# Patient Record
Sex: Female | Born: 1990 | Race: Black or African American | Hispanic: No | Marital: Single | Smoking: Former smoker
Health system: Southern US, Community
[De-identification: ages and names within clinical notes are randomized; demographics above are authoritative.]

## PROBLEM LIST (undated history)

## (undated) ENCOUNTER — Inpatient Hospital Stay (HOSPITAL_COMMUNITY): Payer: Commercial Managed Care - HMO

## (undated) DIAGNOSIS — F419 Anxiety disorder, unspecified: Secondary | ICD-10-CM

## (undated) DIAGNOSIS — E785 Hyperlipidemia, unspecified: Secondary | ICD-10-CM

## (undated) DIAGNOSIS — K219 Gastro-esophageal reflux disease without esophagitis: Secondary | ICD-10-CM

## (undated) DIAGNOSIS — J45909 Unspecified asthma, uncomplicated: Secondary | ICD-10-CM

## (undated) DIAGNOSIS — I10 Essential (primary) hypertension: Secondary | ICD-10-CM

## (undated) DIAGNOSIS — E669 Obesity, unspecified: Secondary | ICD-10-CM

## (undated) DIAGNOSIS — O24419 Gestational diabetes mellitus in pregnancy, unspecified control: Secondary | ICD-10-CM

## (undated) HISTORY — DX: Hyperlipidemia, unspecified: E78.5

## (undated) HISTORY — DX: Gastro-esophageal reflux disease without esophagitis: K21.9

## (undated) HISTORY — PX: NO PAST SURGERIES: SHX2092

---

## 2012-03-31 ENCOUNTER — Emergency Department (HOSPITAL_COMMUNITY)
Admission: EM | Admit: 2012-03-31 | Discharge: 2012-03-31 | Disposition: A | Discharge status: Home / Self Care | Payer: Self-pay | Attending: Emergency Medicine | Admitting: Emergency Medicine

## 2012-03-31 ENCOUNTER — Encounter (HOSPITAL_COMMUNITY): Payer: Self-pay | Admitting: *Deleted

## 2012-03-31 ENCOUNTER — Emergency Department (HOSPITAL_COMMUNITY): Payer: Self-pay

## 2012-03-31 DIAGNOSIS — F17200 Nicotine dependence, unspecified, uncomplicated: Secondary | ICD-10-CM | POA: Insufficient documentation

## 2012-03-31 DIAGNOSIS — R079 Chest pain, unspecified: Secondary | ICD-10-CM | POA: Insufficient documentation

## 2012-03-31 DIAGNOSIS — Z79899 Other long term (current) drug therapy: Secondary | ICD-10-CM | POA: Insufficient documentation

## 2012-03-31 DIAGNOSIS — I10 Essential (primary) hypertension: Secondary | ICD-10-CM | POA: Insufficient documentation

## 2012-03-31 DIAGNOSIS — K219 Gastro-esophageal reflux disease without esophagitis: Secondary | ICD-10-CM | POA: Insufficient documentation

## 2012-03-31 HISTORY — DX: Essential (primary) hypertension: I10

## 2012-03-31 LAB — CBC
HCT: 34.5 % — ABNORMAL LOW (ref 36.0–46.0)
Hemoglobin: 11.2 g/dL — ABNORMAL LOW (ref 12.0–15.0)
MCHC: 32.5 g/dL (ref 30.0–36.0)

## 2012-03-31 LAB — POCT I-STAT TROPONIN I

## 2012-03-31 LAB — BASIC METABOLIC PANEL
CO2: 25 mEq/L (ref 19–32)
Calcium: 9.5 mg/dL (ref 8.4–10.5)
Glucose, Bld: 107 mg/dL — ABNORMAL HIGH (ref 70–99)
Potassium: 4.1 mEq/L (ref 3.5–5.1)
Sodium: 139 mEq/L (ref 135–145)

## 2012-03-31 MED ORDER — OMEPRAZOLE 20 MG PO CPDR: 20.0000 mg | DELAYED_RELEASE_CAPSULE | Freq: Every day | ORAL | Status: DC

## 2012-03-31 MED ORDER — GI COCKTAIL ~~LOC~~
30.0000 mL | Freq: Once | ORAL | Status: AC
  Administered 2012-03-31: 30 mL via ORAL
  Filled 2012-03-31: qty 30

## 2012-03-31 NOTE — ED Notes (Signed)
Pt c/o chest pressure for about a week, intermittent, center chest, non radiating, pressure increases when pt lays down, mild shortness of breath associated with the pressure. Pt also reports wheezing when she breaths. Respirations are equal and unlabored, skin warm and dry, A&Ox4.

## 2012-03-31 NOTE — ED Provider Notes (Signed)
History     CSN: [id number]  Arrival date & time 03/31/12  0344   First MD Initiated Contact with Patient 03/31/12 0444      Chief Complaint  Patient presents with  . Chest Pain    (Consider location/radiation/quality/duration/timing/severity/associated sxs/prior treatment) The history is provided by the patient.   patient reports a long-standing history of gastroesophageal reflux disease.  This evening she developed worsening discomfort in her chest is worse when she lies flat.  Her symptoms are worsened by food such as fast food and spicy food.  She has no exertional shortness of breath.  No shortness of breath at rest.  No history of DVT or pulmonary embolism.  The radiation of the chest discomfort.  No cough or congestion.  No fevers or chills.  No recent sick contacts.  She denies back pain or flank pain.  No abdominal pain.  No nausea vomiting or diarrhea.  When the discomfort comes on she does feel slightly short of breath.  At this time she is without any shortness of breath.  She reports difficulty sleeping because discomfort and her concern regarding her symptoms .  No family history of early cardiac disease.  Her symptoms are mild in severity.    Past Medical History  Diagnosis Date  . Hypertension     History reviewed. No pertinent past surgical history.  History reviewed. No pertinent family history.  History  Substance Use Topics  . Smoking status: Current Some Day Smoker  . Smokeless tobacco: Never Used  . Alcohol Use: Yes     Comment: occassionally    OB History    Grav Para Term Preterm Abortions TAB SAB Ect Mult Living                  Review of Systems  All other systems reviewed and are negative.    Allergies  Review of patient's allergies indicates no known allergies.  Home Medications   Current Outpatient Rx  Name  Route  Sig  Dispense  Refill  . LISINOPRIL-HYDROCHLOROTHIAZIDE 20-25 MG PO TABS   Oral   Take 1 tablet by mouth daily.          Marland Kitchen OMEPRAZOLE 20 MG PO CPDR   Oral   Take 1 capsule (20 mg total) by mouth daily.   30 capsule   0     BP 128/78  Pulse 81  SpO2 100%  LMP 03/14/2012  Physical Exam  Nursing note and vitals reviewed. Constitutional: She is oriented to person, place, and time. She appears well-developed and well-nourished. No distress.       Morbidly obese  HENT:  Head: Normocephalic and atraumatic.  Eyes: EOM are normal.  Neck: Normal range of motion.  Cardiovascular: Normal rate, regular rhythm and normal heart sounds.   Pulmonary/Chest: Effort normal and breath sounds normal.  Abdominal: Soft. She exhibits no distension. There is no tenderness.  Musculoskeletal: Normal range of motion. She exhibits no edema and no tenderness.  Neurological: She is alert and oriented to person, place, and time.  Skin: Skin is warm and dry.  Psychiatric: She has a normal mood and affect. Judgment normal.    ED Course  Procedures (including critical care time)  Labs Reviewed  CBC - Abnormal; Notable for the following:    Hemoglobin 11.2 (*)     HCT 34.5 (*)     Platelets 432 (*)     All other components within normal limits  BASIC METABOLIC PANEL - Abnormal;  Notable for the following:    Glucose, Bld 107 (*)     All other components within normal limits  PRO B NATRIURETIC PEPTIDE  POCT I-STAT TROPONIN I   Dg Chest 2 View  03/31/2012  *RADIOLOGY REPORT*  Clinical Data: Chest pain and pressure.  CHEST - 2 VIEW  Comparison: None.  Findings: The heart size and pulmonary vascularity are normal. The lungs appear clear and expanded without focal air space disease or consolidation. No blunting of the costophrenic angles.  No pneumothorax.  Mediastinal contours appear intact.  IMPRESSION: No evidence of active pulmonary disease.   Original Report Authenticated By: Burman Nieves, M.D.    I personally reviewed the imaging tests through PACS system I reviewed available ER/hospitalization records through the  EMR   Date: 03/31/2012  Rate: 84  Rhythm: normal sinus rhythm  QRS Axis: normal  Intervals: normal  ST/T Wave abnormalities: normal  Conduction Disutrbances: none  Narrative Interpretation:   Old EKG Reviewed: no prior ecg     1. Chest pain   2. GERD (gastroesophageal reflux disease)       MDM  5:55 AM Patient feels much better after GI cocktail.  I suspect this is GERD.  We had a prolonged discussion about weight loss and importance of diet, exercise.  Doubt pulmonary embolism.  Patient is PERC negative.  Discharge home in good condition        Lyanne Co, MD 03/31/12 [phone]

## 2012-03-31 NOTE — ED Notes (Signed)
Patient transported to X-ray 

## 2012-08-07 ENCOUNTER — Encounter (HOSPITAL_COMMUNITY): Payer: Self-pay | Admitting: *Deleted

## 2012-08-07 ENCOUNTER — Emergency Department (HOSPITAL_COMMUNITY)
Admission: EM | Admit: 2012-08-07 | Discharge: 2012-08-07 | Disposition: A | Discharge status: Home / Self Care | Payer: Self-pay | Attending: Emergency Medicine | Admitting: Emergency Medicine

## 2012-08-07 DIAGNOSIS — K089 Disorder of teeth and supporting structures, unspecified: Secondary | ICD-10-CM | POA: Insufficient documentation

## 2012-08-07 DIAGNOSIS — I10 Essential (primary) hypertension: Secondary | ICD-10-CM | POA: Insufficient documentation

## 2012-08-07 DIAGNOSIS — Z79899 Other long term (current) drug therapy: Secondary | ICD-10-CM | POA: Insufficient documentation

## 2012-08-07 DIAGNOSIS — F17200 Nicotine dependence, unspecified, uncomplicated: Secondary | ICD-10-CM | POA: Insufficient documentation

## 2012-08-07 DIAGNOSIS — K0889 Other specified disorders of teeth and supporting structures: Secondary | ICD-10-CM

## 2012-08-07 MED ORDER — IBUPROFEN 600 MG PO TABS: 600.0000 mg | ORAL_TABLET | Freq: Four times a day (QID) | ORAL | Status: DC | PRN

## 2012-08-07 MED ORDER — HYDROCODONE-ACETAMINOPHEN 5-325 MG PO TABS: 1.0000 | ORAL_TABLET | ORAL | Status: DC | PRN

## 2012-08-07 MED ORDER — PENICILLIN V POTASSIUM 500 MG PO TABS: 500.0000 mg | ORAL_TABLET | Freq: Three times a day (TID) | ORAL | Status: DC

## 2012-08-07 MED ORDER — PENICILLIN V POTASSIUM 250 MG PO TABS
500.0000 mg | ORAL_TABLET | Freq: Once | ORAL | Status: AC
  Administered 2012-08-07: 500 mg via ORAL
  Filled 2012-08-07: qty 2

## 2012-08-07 NOTE — ED Provider Notes (Signed)
History     CSN: [id number]  Arrival date & time 08/07/12  Tammie Thomas   First MD Initiated Contact with Patient 08/07/12 0710      Chief Complaint  Patient presents with  . Dental Pain    (Consider location/radiation/quality/duration/timing/severity/associated sxs/prior treatment) HPI  22 year old female presents complaining of dental pain. Patient reports for the past week she has gradual onset of pain to the right lower molar. Describe pain as achy sensation, 7/10, radiates to her right ear, worsening with anything cold. No fever, chills, hearing changes, sore throat, runny nose, sneezing, neck pain, or rash. Denies any recent trauma. Patient took ibuprofen this morning with minimal relief. Patient is an occasional smoker. No drugs allergies.  Past Medical History  Diagnosis Date  . Hypertension     History reviewed. No pertinent past surgical history.  No family history on file.  History  Substance Use Topics  . Smoking status: Current Some Day Smoker  . Smokeless tobacco: Never Used  . Alcohol Use: Yes     Comment: occassionally    OB History   Grav Para Term Preterm Abortions TAB SAB Ect Mult Living                  Review of Systems  Constitutional: Negative for fever.  HENT: Positive for dental problem.   Skin: Negative for rash.  Neurological: Negative for headaches.    Allergies  Review of patient's allergies indicates no known allergies.  Home Medications   Current Outpatient Rx  Name  Route  Sig  Dispense  Refill  . ibuprofen (ADVIL,MOTRIN) 200 MG tablet   Oral   Take 400 mg by mouth every 6 (six) hours as needed for pain.         Marland Kitchen lisinopril-hydrochlorothiazide (PRINZIDE,ZESTORETIC) 20-25 MG per tablet   Oral   Take 1 tablet by mouth daily.         Marland Kitchen omeprazole (PRILOSEC) 20 MG capsule   Oral   Take 1 capsule (20 mg total) by mouth daily.   30 capsule   0     BP 142/81  Pulse 80  Temp(Src) 98.2 F (36.8 C) (Oral)  Resp 18  SpO2  95%  LMP 07/11/2012  Physical Exam  Nursing note and vitals reviewed. Constitutional: She appears well-developed and well-nourished. No distress.  HENT:  Head: Atraumatic.  Mouth: Normal dentition. Tenderness noted to right lower molar without obvious abscess however this is small old dental avulsion to the edge of the molar tooth. No obvious abscess noted. Surrounding gingiva tender to the touch.  No deep tissue infection noted, no trismus  Ear: Normal ear canals, normal TMs bilaterally  Eyes: Conjunctivae are normal.  Neck: Neck supple.  Lymphadenopathy:    She has no cervical adenopathy.  Skin: No rash noted.    ED Course  Procedures (including critical care time)  7:20 AM Patient presents with dental pain. No obvious dental abscess amenable for drainage. No evidence of deep tissue infection. Patient is instructed to take antibiotic, and pain medication. Follow up with dentist in 2 days.  Labs Reviewed - No data to display No results found.   1. Pain, dental       MDM  BP 142/81  Pulse 80  Temp(Src) 98.2 F (36.8 C) (Oral)  Resp 18  SpO2 95%  LMP 07/11/2012  I have reviewed nursing notes and vital signs.  I reviewed available ER/hospitalization records thought the EMR  Fayrene Helper, PA-C 08/07/12 [phone]

## 2012-08-07 NOTE — ED Notes (Signed)
C/o R lower toothache, radiating to upper jaw and R ear. Onset 1 week ago, (denies: nv, drainage or fever), took ibuprofen 400mg  around 0300.

## 2012-08-13 NOTE — ED Provider Notes (Signed)
Medical screening examination/treatment/procedure(s) were performed by non-physician practitioner and as supervising physician I was immediately available for consultation/collaboration.  Tunisha Ruland M Clifford Benninger, MD 08/13/12 0011 

## 2012-08-21 ENCOUNTER — Encounter (HOSPITAL_COMMUNITY): Payer: Self-pay | Admitting: Emergency Medicine

## 2012-08-21 DIAGNOSIS — R0789 Other chest pain: Secondary | ICD-10-CM | POA: Insufficient documentation

## 2012-08-21 DIAGNOSIS — R42 Dizziness and giddiness: Secondary | ICD-10-CM | POA: Insufficient documentation

## 2012-08-21 DIAGNOSIS — J209 Acute bronchitis, unspecified: Secondary | ICD-10-CM | POA: Insufficient documentation

## 2012-08-21 DIAGNOSIS — F17200 Nicotine dependence, unspecified, uncomplicated: Secondary | ICD-10-CM | POA: Insufficient documentation

## 2012-08-21 DIAGNOSIS — R059 Cough, unspecified: Secondary | ICD-10-CM | POA: Insufficient documentation

## 2012-08-21 DIAGNOSIS — E669 Obesity, unspecified: Secondary | ICD-10-CM | POA: Insufficient documentation

## 2012-08-21 DIAGNOSIS — Z8249 Family history of ischemic heart disease and other diseases of the circulatory system: Secondary | ICD-10-CM | POA: Insufficient documentation

## 2012-08-21 DIAGNOSIS — F411 Generalized anxiety disorder: Secondary | ICD-10-CM | POA: Insufficient documentation

## 2012-08-21 DIAGNOSIS — R05 Cough: Secondary | ICD-10-CM | POA: Insufficient documentation

## 2012-08-21 DIAGNOSIS — Z9119 Patient's noncompliance with other medical treatment and regimen: Secondary | ICD-10-CM | POA: Insufficient documentation

## 2012-08-21 DIAGNOSIS — I10 Essential (primary) hypertension: Secondary | ICD-10-CM | POA: Insufficient documentation

## 2012-08-21 DIAGNOSIS — Z79899 Other long term (current) drug therapy: Secondary | ICD-10-CM | POA: Insufficient documentation

## 2012-08-21 DIAGNOSIS — R0602 Shortness of breath: Secondary | ICD-10-CM | POA: Insufficient documentation

## 2012-08-21 DIAGNOSIS — Z91199 Patient's noncompliance with other medical treatment and regimen due to unspecified reason: Secondary | ICD-10-CM | POA: Insufficient documentation

## 2012-08-21 DIAGNOSIS — R7309 Other abnormal glucose: Secondary | ICD-10-CM | POA: Insufficient documentation

## 2012-08-21 MED ORDER — ASPIRIN 81 MG PO CHEW
324.0000 mg | CHEWABLE_TABLET | Freq: Once | ORAL | Status: AC
  Administered 2012-08-21: 324 mg via ORAL
  Filled 2012-08-21: qty 4

## 2012-08-21 NOTE — ED Notes (Signed)
PT. REPORTS INTERMITTENT MID CHEST PAIN WITH SOB , NAUSEA AND PRODUCTIVE COUGH /DIZZINESS. NO MED TAKEN  PTA.

## 2012-08-22 ENCOUNTER — Emergency Department (HOSPITAL_COMMUNITY): Admit: 2012-08-22 | Discharge: 2012-08-22 | Disposition: A | Discharge status: Home / Self Care | Payer: Self-pay

## 2012-08-22 ENCOUNTER — Emergency Department (HOSPITAL_COMMUNITY)
Admission: EM | Admit: 2012-08-22 | Discharge: 2012-08-22 | Disposition: A | Discharge status: Home / Self Care | Payer: Self-pay | Attending: Emergency Medicine | Admitting: Emergency Medicine

## 2012-08-22 DIAGNOSIS — R0789 Other chest pain: Secondary | ICD-10-CM

## 2012-08-22 HISTORY — DX: Anxiety disorder, unspecified: F41.9

## 2012-08-22 HISTORY — DX: Obesity, unspecified: E66.9

## 2012-08-22 LAB — CBC WITH DIFFERENTIAL/PLATELET
HCT: 32.7 % — ABNORMAL LOW (ref 36.0–46.0)
Hemoglobin: 11.1 g/dL — ABNORMAL LOW (ref 12.0–15.0)
Lymphocytes Relative: 52 % — ABNORMAL HIGH (ref 12–46)
Lymphs Abs: 4.1 10*3/uL — ABNORMAL HIGH (ref 0.7–4.0)
Monocytes Absolute: 0.3 10*3/uL (ref 0.1–1.0)
Monocytes Relative: 4 % (ref 3–12)
Neutro Abs: 3.2 10*3/uL (ref 1.7–7.7)
RBC: 4.11 MIL/uL (ref 3.87–5.11)
WBC: 7.9 10*3/uL (ref 4.0–10.5)

## 2012-08-22 LAB — POCT I-STAT TROPONIN I: Troponin i, poc: 0 ng/mL (ref 0.00–0.08)

## 2012-08-22 LAB — COMPREHENSIVE METABOLIC PANEL
BUN: 12 mg/dL (ref 6–23)
CO2: 27 mEq/L (ref 19–32)
Chloride: 101 mEq/L (ref 96–112)
Creatinine, Ser: 0.86 mg/dL (ref 0.50–1.10)
GFR calc non Af Amer: >90 mL/min (ref >90)
Total Bilirubin: 0.1 mg/dL — ABNORMAL LOW (ref 0.3–1.2)

## 2012-08-22 MED ORDER — ALBUTEROL SULFATE HFA 108 (90 BASE) MCG/ACT IN AERS
2.0000 | INHALATION_SPRAY | RESPIRATORY_TRACT | Status: DC | PRN
  Administered 2012-08-22: 2 via RESPIRATORY_TRACT
  Filled 2012-08-22 (×2): qty 6.7

## 2012-08-22 MED ORDER — AEROCHAMBER PLUS W/MASK MISC
1.0000 | Freq: Once | Status: AC
  Administered 2012-08-22: 1
  Filled 2012-08-22 (×2): qty 1

## 2012-08-22 NOTE — ED Notes (Signed)
Pt. Reports intermittent central chest pain/pressure, radiating to bilateral arms with dizziness, nausea, SOB. States pain is worse at night. States "I have anxiety so I just need to make sure it isn't serious because I make the pain worse when I get anxious about it". Pt. Resting comfortably in bed. Denies pain at this time.

## 2012-08-22 NOTE — ED Notes (Signed)
Pt. Alert and oriented x4. No respiratory distress noted.  

## 2012-08-22 NOTE — ED Provider Notes (Signed)
History     CSN: [id number]  Arrival date & time 08/21/12  2318   First MD Initiated Contact with Patient 08/22/12 0057      Chief Complaint  Patient presents with  . Chest Pain    (Consider location/radiation/quality/duration/timing/severity/associated sxs/prior treatment) HPI Place of chest pain onset 3 days ago intermittent with cough and shortness of breath and lightheadedness associated. Pain is improved with walking worse with coughing. No other complaint she admits to noncompliance with her blood pressure medication for several days as she has run out. She was diagnosed with hypertension 2 years ago after her father died and she was under a lot of stress. Chest pain is anterior nonradiating. None at present. Past Medical History  Diagnosis Date  . Hypertension   . Obesity   . Anxiety    cardiac risk factors hypertension, family history father had MI age 19  History reviewed. No pertinent past surgical history.  No family history on file.  History  Substance Use Topics  . Smoking status: Current Some Day Smoker  . Smokeless tobacco: Never Used  . Alcohol Use: Yes     Comment: occassionally   Former smoker no alcohol no drugs OB History   Grav Para Term Preterm Abortions TAB SAB Ect Mult Living                  Review of Systems  Respiratory: Positive for cough and shortness of breath.   Cardiovascular: Positive for chest pain.  Neurological: Positive for dizziness.  All other systems reviewed and are negative.    Allergies  Review of patient's allergies indicates no known allergies.  Home Medications   Current Outpatient Rx  Name  Route  Sig  Dispense  Refill  . acetaminophen (TYLENOL) 500 MG tablet   Oral   Take 1,000 mg by mouth every 6 (six) hours as needed for pain.         Marland Kitchen lisinopril-hydrochlorothiazide (PRINZIDE,ZESTORETIC) 20-25 MG per tablet   Oral   Take 1 tablet by mouth daily.           BP 126/77  Pulse 73  Temp(Src) 98.7 F  (37.1 C) (Oral)  Resp 17  SpO2 100%  LMP 08/12/2012  Physical Exam  Nursing note and vitals reviewed. Constitutional: She appears well-developed and well-nourished.  HENT:  Head: Normocephalic and atraumatic.  Eyes: Conjunctivae are normal. Pupils are equal, round, and reactive to light.  Neck: Neck supple. No tracheal deviation present. No thyromegaly present.  Cardiovascular: Normal rate and regular rhythm.   No murmur heard. Pulmonary/Chest: Effort normal and breath sounds normal.  Abdominal: Soft. Bowel sounds are normal. She exhibits no distension. There is no tenderness.  obese  Musculoskeletal: Normal range of motion. She exhibits no edema and no tenderness.  Neurological: She is alert. Coordination normal.  Skin: Skin is warm and dry. No rash noted.  Psychiatric: She has a normal mood and affect.    ED Course  Procedures (including critical care time)  Labs Reviewed  CBC WITH DIFFERENTIAL - Abnormal; Notable for the following:    Hemoglobin 11.1 (*)    HCT 32.7 (*)    Neutrophils Relative 40 (*)    Lymphocytes Relative 52 (*)    Lymphs Abs 4.1 (*)    All other components within normal limits  COMPREHENSIVE METABOLIC PANEL - Abnormal; Notable for the following:    Glucose, Bld 134 (*)    Albumin 3.3 (*)    Total Bilirubin 0.1 (*)  All other components within normal limits   Dg Chest 2 View  08/22/2012  *RADIOLOGY REPORT*  Clinical Data: Chest pain  CHEST - 2 VIEW  Comparison: 03/31/2012  Findings: Lungs clear.  Cardiomediastinal contours within normal range.  No pleural effusion or pneumothorax.  No acute osseous finding.  IMPRESSION: No radiographic evidence of acute cardiopulmonary process.   Original Report Authenticated By: Jearld Lesch, M.D.    Chest x-ray viewed by me  No diagnosis found.  Date: 08/22/2012  Rate: 80  Rhythm: normal sinus rhythm  QRS Axis: normal  Intervals: normal  ST/T Wave abnormalities: nonspecific T wave changes  Conduction  Disutrbances:none  Narrative Interpretation:   Old EKG Reviewed: No change from 03/31/2012 interpreted by me  Results for orders placed during the hospital encounter of 08/22/12  CBC WITH DIFFERENTIAL      Result Value Range   WBC 7.9  4.0 - 10.5 K/uL   RBC 4.11  3.87 - 5.11 MIL/uL   Hemoglobin 11.1 (*) 12.0 - 15.0 g/dL   HCT 11.9 (*) 14.7 - 82.9 %   MCV 79.6  78.0 - 100.0 fL   MCH 27.0  26.0 - 34.0 pg   MCHC 33.9  30.0 - 36.0 g/dL   RDW 56.2  13.0 - 86.5 %   Platelets 371  150 - 400 K/uL   Neutrophils Relative 40 (*) 43 - 77 %   Neutro Abs 3.2  1.7 - 7.7 K/uL   Lymphocytes Relative 52 (*) 12 - 46 %   Lymphs Abs 4.1 (*) 0.7 - 4.0 K/uL   Monocytes Relative 4  3 - 12 %   Monocytes Absolute 0.3  0.1 - 1.0 K/uL   Eosinophils Relative 4  0 - 5 %   Eosinophils Absolute 0.3  0.0 - 0.7 K/uL   Basophils Relative 1  0 - 1 %   Basophils Absolute 0.0  0.0 - 0.1 K/uL  COMPREHENSIVE METABOLIC PANEL      Result Value Range   Sodium 137  135 - 145 mEq/L   Potassium 3.7  3.5 - 5.1 mEq/L   Chloride 101  96 - 112 mEq/L   CO2 27  19 - 32 mEq/L   Glucose, Bld 134 (*) 70 - 99 mg/dL   BUN 12  6 - 23 mg/dL   Creatinine, Ser 7.84  0.50 - 1.10 mg/dL   Calcium 9.5  8.4 - 69.6 mg/dL   Total Protein 7.0  6.0 - 8.3 g/dL   Albumin 3.3 (*) 3.5 - 5.2 g/dL   AST 15  0 - 37 U/L   ALT 18  0 - 35 U/L   Alkaline Phosphatase 76  39 - 117 U/L   Total Bilirubin 0.1 (*) 0.3 - 1.2 mg/dL   GFR calc non Af Amer >90  >90 mL/min   GFR calc Af Amer >90  >90 mL/min   Dg Chest 2 View  08/22/2012  *RADIOLOGY REPORT*  Clinical Data: Chest pain  CHEST - 2 VIEW  Comparison: 03/31/2012  Findings: Lungs clear.  Cardiomediastinal contours within normal range.  No pleural effusion or pneumothorax.  No acute osseous finding.  IMPRESSION: No radiographic evidence of acute cardiopulmonary process.   Original Report Authenticated By: Jearld Lesch, M.D.      MDM     strongly doubt acute coronary syndrome in this young  menstruating female with highly atypical symptoms i.e. pain is improved with exertion. Doubt pulmonary embolism highly atypical symptoms symptoms are intermittent and  with cough. We will not restart her antihypertensive as she's had 3 normal blood pressure while here plan albuterol inhaler with spacer to go. Referral resource guide Diagnosis #1 atypical chest pain #2 bronchitis #hyperglycemia     Doug Sou, MD 08/22/12 0130

## 2012-10-24 ENCOUNTER — Encounter (HOSPITAL_COMMUNITY): Payer: Self-pay

## 2012-10-24 ENCOUNTER — Emergency Department (HOSPITAL_COMMUNITY)
Admission: EM | Admit: 2012-10-24 | Discharge: 2012-10-24 | Disposition: A | Discharge status: Home / Self Care | Payer: Self-pay | Attending: Emergency Medicine | Admitting: Emergency Medicine

## 2012-10-24 DIAGNOSIS — Z7982 Long term (current) use of aspirin: Secondary | ICD-10-CM | POA: Insufficient documentation

## 2012-10-24 DIAGNOSIS — F411 Generalized anxiety disorder: Secondary | ICD-10-CM | POA: Insufficient documentation

## 2012-10-24 DIAGNOSIS — E669 Obesity, unspecified: Secondary | ICD-10-CM | POA: Insufficient documentation

## 2012-10-24 DIAGNOSIS — Z3202 Encounter for pregnancy test, result negative: Secondary | ICD-10-CM | POA: Insufficient documentation

## 2012-10-24 DIAGNOSIS — R600 Localized edema: Secondary | ICD-10-CM

## 2012-10-24 DIAGNOSIS — F17200 Nicotine dependence, unspecified, uncomplicated: Secondary | ICD-10-CM | POA: Insufficient documentation

## 2012-10-24 DIAGNOSIS — R609 Edema, unspecified: Secondary | ICD-10-CM | POA: Insufficient documentation

## 2012-10-24 DIAGNOSIS — I10 Essential (primary) hypertension: Secondary | ICD-10-CM | POA: Insufficient documentation

## 2012-10-24 LAB — POCT I-STAT, CHEM 8
Chloride: 102 mEq/L (ref 96–112)
Creatinine, Ser: 0.8 mg/dL (ref 0.50–1.10)
Glucose, Bld: 97 mg/dL (ref 70–99)
HCT: 34 % — ABNORMAL LOW (ref 36.0–46.0)
Hemoglobin: 11.6 g/dL — ABNORMAL LOW (ref 12.0–15.0)
Potassium: 4.1 mEq/L (ref 3.5–5.1)
Sodium: 139 mEq/L (ref 135–145)

## 2012-10-24 LAB — URINALYSIS, ROUTINE W REFLEX MICROSCOPIC
Bilirubin Urine: NEGATIVE
Glucose, UA: NEGATIVE mg/dL
Hgb urine dipstick: NEGATIVE
Ketones, ur: NEGATIVE mg/dL
Leukocytes, UA: NEGATIVE
Nitrite: NEGATIVE
Protein, ur: NEGATIVE mg/dL
Specific Gravity, Urine: 1.015 (ref 1.005–1.030)
Urobilinogen, UA: 0.2 mg/dL (ref 0.0–1.0)
pH: 7.5 (ref 5.0–8.0)

## 2012-10-24 NOTE — ED Provider Notes (Signed)
History    CSN: [id number] Arrival date & time 10/24/12  0830  First MD Initiated Contact with Patient 10/24/12 [phone]     Chief Complaint  Patient presents with  . Leg Swelling   (Consider location/radiation/quality/duration/timing/severity/associated sxs/prior Treatment) HPI Patient presents to the emergency department with a complaint of bilateral lower leg swelling.  Patient, states, that this has been an ongoing problem, that she's noticed bruising in the back of her leg X2 days. Patient states, that she does not have any pain in her legs except when she stands for long periods of time.  Patient denies chest pain, shortness of breath, nausea, vomiting, diarrhea, abdominal pain, back pain, fever, cough, dizziness, weakness, numbness, syncope, rash or diarrhea.  Patient, states she's not had any redness or warmth to her legs.  Patient, states she, notices it's worse after standing at work for long periods of time.  Past Medical History  Diagnosis Date  . Hypertension   . Obesity   . Anxiety    History reviewed. No pertinent past surgical history. No family history on file. History  Substance Use Topics  . Smoking status: Current Some Day Smoker  . Smokeless tobacco: Never Used  . Alcohol Use: Yes     Comment: occassionally   OB History   Grav Para Term Preterm Abortions TAB SAB Ect Mult Living                 Review of Systems All other systems negative except as documented in the HPI. All pertinent positives and negatives as reviewed in the HPI. Allergies  Review of patient's allergies indicates no known allergies.  Home Medications   Current Outpatient Rx  Name  Route  Sig  Dispense  Refill  . aspirin 325 MG tablet   Oral   Take 650 mg by mouth daily.          BP 133/65  Pulse 60  Temp(Src) 98.7 F (37.1 C) (Oral)  Resp 19  Ht 5\' 9"  (1.753 m)  Wt 317 lb (143.79 kg)  BMI 46.79 kg/m2  SpO2 99%  LMP 10/12/2012 Physical Exam  Nursing note and vitals  reviewed. Constitutional: She is oriented to person, place, and time. She appears well-developed and well-nourished. No distress.  HENT:  Head: Normocephalic and atraumatic.  Mouth/Throat: Oropharynx is clear and moist.  Eyes: Pupils are equal, round, and reactive to light.  Neck: Normal range of motion. Neck supple.  Cardiovascular: Normal rate, regular rhythm and normal heart sounds.  Exam reveals no gallop and no friction rub.   No murmur heard. Pulmonary/Chest: Effort normal and breath sounds normal. No respiratory distress.  Abdominal: Soft. Bowel sounds are normal. She exhibits no distension.  Musculoskeletal:  Patient may have a small trace amount of edema in both lower legs.  No signs of redness or warmth to the lower extremities.  Patient has no calf pain  Neurological: She is alert and oriented to person, place, and time.  Skin: Skin is warm and dry.    ED Course  Procedures (including critical care time) Labs Reviewed  POCT I-STAT, CHEM 8 - Abnormal; Notable for the following:    Hemoglobin 11.6 (*)    HCT 34.0 (*)    All other components within normal limits  URINALYSIS, ROUTINE W REFLEX MICROSCOPIC  POCT PREGNANCY, URINE    Patient is advised to followup with her primary care Dr. I feel that the patient's trace edema is most likely related to her weight and  standing for long periods of time.  Patient does not have any signs of blood clot or congestive heart failure.  Patient's kidney function is also normal.  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

## 2012-10-24 NOTE — ED Notes (Signed)
Pt with bilateral lower extremity swelling for approx 1 month. Denies SOB or fever.

## 2012-10-25 NOTE — ED Provider Notes (Signed)
Medical screening examination/treatment/procedure(s) were performed by non-physician practitioner and as supervising physician I was immediately available for consultation/collaboration.   Skyeler Smola, MD 10/25/12 1635 

## 2012-12-19 ENCOUNTER — Encounter (HOSPITAL_COMMUNITY): Payer: Self-pay | Admitting: Physical Medicine and Rehabilitation

## 2012-12-19 ENCOUNTER — Emergency Department (HOSPITAL_COMMUNITY)
Admission: EM | Admit: 2012-12-19 | Discharge: 2012-12-19 | Disposition: A | Discharge status: Home / Self Care | Payer: Self-pay | Attending: Emergency Medicine | Admitting: Emergency Medicine

## 2012-12-19 DIAGNOSIS — F17200 Nicotine dependence, unspecified, uncomplicated: Secondary | ICD-10-CM | POA: Insufficient documentation

## 2012-12-19 DIAGNOSIS — E669 Obesity, unspecified: Secondary | ICD-10-CM | POA: Insufficient documentation

## 2012-12-19 DIAGNOSIS — I10 Essential (primary) hypertension: Secondary | ICD-10-CM | POA: Insufficient documentation

## 2012-12-19 DIAGNOSIS — M549 Dorsalgia, unspecified: Secondary | ICD-10-CM | POA: Insufficient documentation

## 2012-12-19 DIAGNOSIS — Z8659 Personal history of other mental and behavioral disorders: Secondary | ICD-10-CM | POA: Insufficient documentation

## 2012-12-19 DIAGNOSIS — Z7982 Long term (current) use of aspirin: Secondary | ICD-10-CM | POA: Insufficient documentation

## 2012-12-19 MED ORDER — IBUPROFEN 800 MG PO TABS: 800.0000 mg | ORAL_TABLET | Freq: Three times a day (TID) | ORAL | Status: DC

## 2012-12-19 MED ORDER — METHOCARBAMOL 500 MG PO TABS: 500.0000 mg | ORAL_TABLET | Freq: Two times a day (BID) | ORAL | Status: DC

## 2012-12-19 MED ORDER — KETOROLAC TROMETHAMINE 60 MG/2ML IM SOLN
60.0000 mg | Freq: Once | INTRAMUSCULAR | Status: AC
  Administered 2012-12-19: 60 mg via INTRAMUSCULAR
  Filled 2012-12-19: qty 2

## 2012-12-19 NOTE — ED Notes (Signed)
Pt presents to department for evaluation of back pain. Ongoing x1 week. Also states pain radiates to both arms and legs. 7/10 pain at the time, increases with movement. Denies recent injury. Pt is alert and oriented x4.

## 2012-12-19 NOTE — ED Provider Notes (Signed)
CSN: [id number]     Arrival date & time 12/19/12  1628 History   First MD Initiated Contact with Patient 12/19/12 1800     Chief Complaint  Patient presents with  . Back Pain   (Consider location/radiation/quality/duration/timing/severity/associated sxs/prior Treatment) HPI Comments: Patient presents emergency department with chief complaint of back pain. She states that she has had back pain times one week. She denies any known mechanism of injury. She states the pain is worsened with movement, twisting, and bending. She states the pain is 7/10, and endorses some radiation of her symptoms down the left arm. She is tried taking Tylenol with some relief. 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 She is on her feet walking a lot. She denies any fevers, IV drug use, or motor deficits. Denies any saddle anesthesia, or bowel or bladder incontinence. Denies any ataxia.  The history is provided by the patient. No language interpreter was used.    Past Medical History  Diagnosis Date  . Hypertension   . Obesity   . Anxiety    No past surgical history on file. No family history on file. History  Substance Use Topics  . Smoking status: Current Some Day Smoker  . Smokeless tobacco: Never Used  . Alcohol Use: Yes     Comment: occassionally   OB History   Grav Para Term Preterm Abortions TAB SAB Ect Mult Living                 Review of Systems  All other systems reviewed and are negative.    Allergies  Review of patient's allergies indicates no known allergies.  Home Medications   Current Outpatient Rx  Name  Route  Sig  Dispense  Refill  . aspirin 325 MG tablet   Oral   Take 650 mg by mouth daily.         Marland Kitchen aspirin-acetaminophen-caffeine (EXCEDRIN MIGRAINE) 250-250-65 MG per tablet   Oral   Take 1 tablet by mouth every 6 (six) hours as needed for pain.         Marland Kitchen ibuprofen (ADVIL,MOTRIN) 800 MG tablet   Oral   Take 1 tablet (800 mg total)  by mouth 3 (three) times daily.   21 tablet   0   . methocarbamol (ROBAXIN) 500 MG tablet   Oral   Take 1 tablet (500 mg total) by mouth 2 (two) times daily.   20 tablet   0    BP 138/84  Pulse 66  Temp(Src) 99 F (37.2 C) (Oral)  Resp 18  Wt 315 lb 4.8 oz (143.019 kg)  BMI 46.54 kg/m2  SpO2 100% Physical Exam  Nursing note and vitals reviewed. Constitutional: She is oriented to person, place, and time. She appears well-developed and well-nourished. No distress.  HENT:  Head: Normocephalic and atraumatic.  Eyes: Conjunctivae and EOM are normal. Right eye exhibits no discharge. Left eye exhibits no discharge. No scleral icterus.  Neck: Normal range of motion. Neck supple. No tracheal deviation present.  Cardiovascular: Normal rate, regular rhythm and normal heart sounds.  Exam reveals no gallop and no friction rub.   No murmur heard. Pulmonary/Chest: Effort normal and breath sounds normal. No respiratory distress. She has no wheezes.  Abdominal: Soft. She exhibits no distension. There is no tenderness.  Musculoskeletal: Normal range of motion.  Left-sided rhomboid muscles tender to palpation, no bony tenderness, step-offs, or gross abnormality or deformity of spine, patient is able to ambulate,  moves all extremities  Neurological: She is alert and oriented to person, place, and time.  Sensation and strength intact bilaterally  Skin: Skin is warm. She is not diaphoretic.  Psychiatric: She has a normal mood and affect. Her behavior is normal. Judgment and thought content normal.    ED Course  Procedures (including critical care time) Labs Review Labs Reviewed - No data to display Imaging Review No results found.  MDM   1. Back pain    Patient with back pain.  No neurological deficits and normal neuro exam.  Patient can walk but states is painful.  No loss of bowel or bladder control.  No concern for cauda equina.  No fever, night sweats, weight loss, h/o cancer, IVDU.   RICE protocol and pain medicine indicated and discussed with patient.      Roxy Horseman, PA-C 12/19/12 1816

## 2012-12-19 NOTE — ED Provider Notes (Signed)
Medical screening examination/treatment/procedure(s) were performed by non-physician practitioner and as supervising physician I was immediately available for consultation/collaboration.   Glynn Octave, MD 12/19/12 [phone]

## 2013-02-20 ENCOUNTER — Emergency Department (HOSPITAL_COMMUNITY)
Admission: EM | Admit: 2013-02-20 | Discharge: 2013-02-20 | Disposition: A | Discharge status: Home / Self Care | Payer: Self-pay | Attending: Emergency Medicine | Admitting: Emergency Medicine

## 2013-02-20 ENCOUNTER — Emergency Department (HOSPITAL_COMMUNITY): Payer: Self-pay

## 2013-02-20 ENCOUNTER — Encounter (HOSPITAL_COMMUNITY): Payer: Self-pay | Admitting: Emergency Medicine

## 2013-02-20 DIAGNOSIS — F411 Generalized anxiety disorder: Secondary | ICD-10-CM | POA: Insufficient documentation

## 2013-02-20 DIAGNOSIS — E669 Obesity, unspecified: Secondary | ICD-10-CM | POA: Insufficient documentation

## 2013-02-20 DIAGNOSIS — Z3202 Encounter for pregnancy test, result negative: Secondary | ICD-10-CM | POA: Insufficient documentation

## 2013-02-20 DIAGNOSIS — J45909 Unspecified asthma, uncomplicated: Secondary | ICD-10-CM | POA: Insufficient documentation

## 2013-02-20 DIAGNOSIS — M25519 Pain in unspecified shoulder: Secondary | ICD-10-CM | POA: Insufficient documentation

## 2013-02-20 DIAGNOSIS — Z79899 Other long term (current) drug therapy: Secondary | ICD-10-CM | POA: Insufficient documentation

## 2013-02-20 DIAGNOSIS — F17200 Nicotine dependence, unspecified, uncomplicated: Secondary | ICD-10-CM | POA: Insufficient documentation

## 2013-02-20 DIAGNOSIS — I10 Essential (primary) hypertension: Secondary | ICD-10-CM | POA: Insufficient documentation

## 2013-02-20 DIAGNOSIS — R000 Tachycardia, unspecified: Secondary | ICD-10-CM | POA: Insufficient documentation

## 2013-02-20 DIAGNOSIS — M791 Myalgia, unspecified site: Secondary | ICD-10-CM

## 2013-02-20 HISTORY — DX: Unspecified asthma, uncomplicated: J45.909

## 2013-02-20 LAB — COMPREHENSIVE METABOLIC PANEL
ALT: 19 U/L (ref 0–35)
AST: 16 U/L (ref 0–37)
Alkaline Phosphatase: 82 U/L (ref 39–117)
CO2: 25 mEq/L (ref 19–32)
Chloride: 100 mEq/L (ref 96–112)
GFR calc Af Amer: >90 mL/min (ref >90)
GFR calc non Af Amer: >90 mL/min (ref >90)
Glucose, Bld: 99 mg/dL (ref 70–99)
Sodium: 136 mEq/L (ref 135–145)
Total Bilirubin: 0.3 mg/dL (ref 0.3–1.2)

## 2013-02-20 LAB — URINALYSIS, ROUTINE W REFLEX MICROSCOPIC
Bilirubin Urine: NEGATIVE
Hgb urine dipstick: NEGATIVE
Ketones, ur: NEGATIVE mg/dL
Nitrite: NEGATIVE
Protein, ur: NEGATIVE mg/dL
Urobilinogen, UA: 0.2 mg/dL (ref 0.0–1.0)

## 2013-02-20 LAB — CBC
Hemoglobin: 12.3 g/dL (ref 12.0–15.0)
MCH: 27.8 pg (ref 26.0–34.0)
Platelets: 409 10*3/uL — ABNORMAL HIGH (ref 150–400)
RBC: 4.43 MIL/uL (ref 3.87–5.11)
WBC: 6.6 10*3/uL (ref 4.0–10.5)

## 2013-02-20 LAB — POCT I-STAT TROPONIN I: Troponin i, poc: 0 ng/mL (ref 0.00–0.08)

## 2013-02-20 MED ORDER — METHOCARBAMOL 500 MG PO TABS: 500.0000 mg | ORAL_TABLET | Freq: Three times a day (TID) | ORAL | Status: DC | PRN

## 2013-02-20 MED ORDER — NAPROXEN 500 MG PO TABS: 500.0000 mg | ORAL_TABLET | Freq: Two times a day (BID) | ORAL | Status: DC

## 2013-02-20 NOTE — ED Notes (Addendum)
Cp x 2 weeks  Some nausea  Cough pain rads to back and pt requesting urine preg test and she might have uti

## 2013-02-20 NOTE — ED Provider Notes (Addendum)
CSN: [id number]     Arrival date & time 02/20/13  1354 History   First MD Initiated Contact with Patient 02/20/13 1555     Chief Complaint  Patient presents with  . Chest Pain    HPI  Patient presents with shoulder pain she states initially for about one week's time. She states she was here about a week ago and had it looked at. In review of her chart this was August 31. In retrospect she states it was about a month ago.  Again her evaluation was a weeks ago.  She felt it might be related to some new exercises she was doing. She had a home DDD is doing some exercises. She is doing a modified portion which means she is doing her pushups from her knees rather than with full plank position.  She has a soreness in the left shoulder. Occasionally hurts to move. She is not having trouble sleeping. She feels like she is carrying stress in her shoulders and back.  No history of vascular abnormalities. No history of hemoglobinopathies. No prolonged immobilization cast splints fractures surgeries malignancies or other DVT or PE risks. She does smoke. She is not on hormones.  Past Medical History  Diagnosis Date  . Hypertension   . Obesity   . Anxiety   . Asthma    History reviewed. No pertinent past surgical history. History reviewed. No pertinent family history. History  Substance Use Topics  . Smoking status: Current Some Day Smoker  . Smokeless tobacco: Never Used  . Alcohol Use: Yes     Comment: occassionally   OB History   Grav Para Term Preterm Abortions TAB SAB Ect Mult Living                 Review of Systems  Constitutional: Negative for fever, chills, diaphoresis, appetite change and fatigue.  HENT: Negative for mouth sores, sore throat and trouble swallowing.   Eyes: Negative for visual disturbance.  Respiratory: Negative for cough, chest tightness, shortness of breath and wheezing.   Cardiovascular: Negative for chest pain.  Gastrointestinal: Negative for nausea, vomiting,  abdominal pain, diarrhea and abdominal distention.  Endocrine: Negative for polydipsia, polyphagia and polyuria.  Genitourinary: Negative for dysuria, frequency and hematuria.  Musculoskeletal: Negative for gait problem.       Left shoulder pain  Skin: Negative for color change, pallor and rash.  Neurological: Negative for dizziness, syncope, light-headedness and headaches.  Hematological: Does not bruise/bleed easily.  Psychiatric/Behavioral: Negative for behavioral problems and confusion.    Allergies  Review of patient's allergies indicates no known allergies.  Home Medications   Current Outpatient Rx  Name  Route  Sig  Dispense  Refill  . methocarbamol (ROBAXIN) 500 MG tablet   Oral   Take 1 tablet (500 mg total) by mouth 3 (three) times daily between meals as needed.   20 tablet   0   . naproxen (NAPROSYN) 500 MG tablet   Oral   Take 1 tablet (500 mg total) by mouth 2 (two) times daily.   30 tablet   0    BP 148/78  Pulse 87  Temp(Src) 98.8 F (37.1 C)  Resp 16  SpO2 99%  LMP 02/03/2013 Physical Exam  Constitutional: She is oriented to person, place, and time. She appears well-developed and well-nourished. No distress.  HENT:  Head: Normocephalic.  Eyes: Conjunctivae are normal. Pupils are equal, round, and reactive to light. No scleral icterus.  Neck: Normal range of motion. Neck  supple. No thyromegaly present.  Cardiovascular: Normal rate and regular rhythm.  Exam reveals no gallop and no friction rub.   No murmur heard. Pulmonary/Chest: Effort normal and breath sounds normal. No respiratory distress. She has no wheezes. She has no rales.  Abdominal: Soft. Bowel sounds are normal. She exhibits no distension. There is no tenderness. There is no rebound.  Musculoskeletal: Normal range of motion.  Full range of motion of his shoulder. No pain with abduction. Does not have a painful ARC. No pain with external or internal rotation. She can place upon my head and  hand behind back. Nontender to palpate over the supraspinatus and infraspinatus fossa. Nontender over the anterior chest.  Neurological: She is alert and oriented to person, place, and time.  Skin: Skin is warm and dry. No rash noted.  Psychiatric: She has a normal mood and affect. Her behavior is normal.    ED Course  Procedures (including critical care time) Labs Review Labs Reviewed  CBC - Abnormal; Notable for the following:    Platelets 409 (*)    All other components within normal limits  URINALYSIS, ROUTINE W REFLEX MICROSCOPIC - Abnormal; Notable for the following:    APPearance CLOUDY (*)    Specific Gravity, Urine 1.033 (*)    All other components within normal limits  COMPREHENSIVE METABOLIC PANEL  POCT PREGNANCY, URINE  POCT I-STAT TROPONIN I   Imaging Review Dg Chest 2 View  02/20/2013   CLINICAL DATA:  Chest pain radiating to left arm.  Tachycardia.  EXAM: CHEST  2 VIEW  COMPARISON:  08/22/2012  FINDINGS: The heart size and mediastinal contours are within normal limits. Both lungs are clear. The visualized skeletal structures are unremarkable.  IMPRESSION: No active cardiopulmonary disease.   Electronically Signed   By: Myles Rosenthal M.D.   On: 02/20/2013 15:26    EKG Interpretation     Ventricular Rate:  86 PR Interval:  138 QRS Duration: 92 QT Interval:  352 QTC Calculation: 421 R Axis:   70 Text Interpretation:  Normal sinus rhythm Normal ECG            MDM   1. Muscular pain    Reassuring exam. Normal chest x-ray. Normal EKG. Normal troponin. Normal white blood cell count. Normal exam. I think her pain is likely related to her new exercise program. I have asked her to avoid activities or exercise if  it uses the left shoulder. This is not limiting her activities of daily living. She works as a Electrical engineer. Plan anti-inflammatories, muscle relaxants, direct or follow up if not improving    Roney Marion, MD 02/20/13 1654  Roney Marion,  MD 02/20/13 1655

## 2013-07-25 ENCOUNTER — Other Ambulatory Visit: Payer: Self-pay | Admitting: General Practice

## 2013-07-25 ENCOUNTER — Telehealth: Payer: Self-pay | Admitting: *Deleted

## 2013-07-25 DIAGNOSIS — L680 Hirsutism: Secondary | ICD-10-CM

## 2013-07-25 DIAGNOSIS — N915 Oligomenorrhea, unspecified: Secondary | ICD-10-CM

## 2013-07-25 NOTE — Telephone Encounter (Signed)
Patient called nurse line returning call from the clinic.  Spoke with patient concerning lab appointment.  Pt can come on 4/15 at 1500.

## 2013-07-25 NOTE — Progress Notes (Signed)
Per Dr Burnice LoganHarraway Smith's recommendations, ordered tsh, fsh/lh, dhea-s, and hcg Called patient to notify her we need her to come in for labs prior to MD visit, no answer-left message that we are trying to reach you about an appt please call us back at the clinics.

## 2013-08-03 ENCOUNTER — Other Ambulatory Visit: Payer: Self-pay

## 2013-08-10 ENCOUNTER — Encounter: Payer: Self-pay | Admitting: Obstetrics & Gynecology

## 2013-08-10 ENCOUNTER — Telehealth: Payer: Self-pay

## 2013-08-10 NOTE — Telephone Encounter (Signed)
Pt. No showed to appointment this afternoon with Dr. Erin FullingHarraway-Smith. Pt. Also no-showed to previous appointment to have labs drawn (was referred and needed labs prior to appointment). Called pt. No answer. Left message stating we are sorry you missed your appointment, if you would like to re-schedule please call our clinic, you will also need to have labs drawn prior to that visit so if you do choose to re-schedule please mention you will need labs prior to your MD appointment.

## 2013-08-12 ENCOUNTER — Encounter: Payer: Self-pay | Admitting: *Deleted

## 2013-08-17 ENCOUNTER — Encounter (HOSPITAL_COMMUNITY): Payer: Self-pay | Admitting: Emergency Medicine

## 2013-08-17 DIAGNOSIS — F430 Acute stress reaction: Secondary | ICD-10-CM | POA: Insufficient documentation

## 2013-08-17 DIAGNOSIS — F410 Panic disorder [episodic paroxysmal anxiety] without agoraphobia: Secondary | ICD-10-CM | POA: Insufficient documentation

## 2013-08-17 DIAGNOSIS — R002 Palpitations: Secondary | ICD-10-CM | POA: Insufficient documentation

## 2013-08-17 DIAGNOSIS — E669 Obesity, unspecified: Secondary | ICD-10-CM | POA: Insufficient documentation

## 2013-08-17 DIAGNOSIS — I10 Essential (primary) hypertension: Secondary | ICD-10-CM | POA: Insufficient documentation

## 2013-08-17 DIAGNOSIS — J45909 Unspecified asthma, uncomplicated: Secondary | ICD-10-CM | POA: Insufficient documentation

## 2013-08-17 NOTE — ED Notes (Signed)
Patient reports she has been having heart palpitation and has a history of anxiety attacks.  Wants to be checked out.  Has been "really stress out" over the last 5 months

## 2013-08-18 ENCOUNTER — Emergency Department (HOSPITAL_COMMUNITY)
Admission: EM | Admit: 2013-08-18 | Discharge: 2013-08-18 | Discharge status: Against Medical Advice | Payer: BC Managed Care – PPO | Attending: Emergency Medicine | Admitting: Emergency Medicine

## 2013-08-22 ENCOUNTER — Other Ambulatory Visit: Payer: Self-pay

## 2013-08-22 ENCOUNTER — Emergency Department (HOSPITAL_COMMUNITY): Payer: BC Managed Care – PPO

## 2013-08-22 ENCOUNTER — Encounter (HOSPITAL_COMMUNITY): Payer: Self-pay | Admitting: Emergency Medicine

## 2013-08-22 ENCOUNTER — Emergency Department (HOSPITAL_COMMUNITY)
Admission: EM | Admit: 2013-08-22 | Discharge: 2013-08-22 | Disposition: A | Discharge status: Home / Self Care | Payer: BC Managed Care – PPO | Attending: Emergency Medicine | Admitting: Emergency Medicine

## 2013-08-22 DIAGNOSIS — J45909 Unspecified asthma, uncomplicated: Secondary | ICD-10-CM | POA: Insufficient documentation

## 2013-08-22 DIAGNOSIS — R002 Palpitations: Secondary | ICD-10-CM | POA: Insufficient documentation

## 2013-08-22 DIAGNOSIS — F419 Anxiety disorder, unspecified: Secondary | ICD-10-CM

## 2013-08-22 DIAGNOSIS — I10 Essential (primary) hypertension: Secondary | ICD-10-CM | POA: Insufficient documentation

## 2013-08-22 DIAGNOSIS — E669 Obesity, unspecified: Secondary | ICD-10-CM | POA: Insufficient documentation

## 2013-08-22 DIAGNOSIS — M25579 Pain in unspecified ankle and joints of unspecified foot: Secondary | ICD-10-CM | POA: Insufficient documentation

## 2013-08-22 DIAGNOSIS — Z791 Long term (current) use of non-steroidal anti-inflammatories (NSAID): Secondary | ICD-10-CM | POA: Insufficient documentation

## 2013-08-22 DIAGNOSIS — Z87891 Personal history of nicotine dependence: Secondary | ICD-10-CM | POA: Insufficient documentation

## 2013-08-22 DIAGNOSIS — F411 Generalized anxiety disorder: Secondary | ICD-10-CM | POA: Insufficient documentation

## 2013-08-22 LAB — I-STAT CHEM 8, ED
BUN: 11 mg/dL (ref 6–23)
CALCIUM ION: 1.21 mmol/L (ref 1.12–1.23)
CREATININE: 0.8 mg/dL (ref 0.50–1.10)
Chloride: 102 mEq/L (ref 96–112)
Glucose, Bld: 86 mg/dL (ref 70–99)
HCT: 37 % (ref 36.0–46.0)
Hemoglobin: 12.6 g/dL (ref 12.0–15.0)
Potassium: 3.7 mEq/L (ref 3.7–5.3)
SODIUM: 140 meq/L (ref 137–147)
TCO2: 23 mmol/L (ref 0–100)

## 2013-08-22 MED ORDER — ALPRAZOLAM 0.25 MG PO TABS: 0.2500 mg | ORAL_TABLET | Freq: Two times a day (BID) | ORAL | Status: DC | PRN

## 2013-08-22 MED ORDER — IBUPROFEN 600 MG PO TABS: 600.0000 mg | ORAL_TABLET | Freq: Four times a day (QID) | ORAL | Status: DC | PRN

## 2013-08-22 NOTE — ED Provider Notes (Signed)
CSN: [id number]     Arrival date & time 08/22/13  0918 History   First MD Initiated Contact with Patient 08/22/13 [id number]     Chief Complaint  Patient presents with  . Chest Pain  . Leg Pain     (Consider location/radiation/quality/duration/timing/severity/associated sxs/prior Treatment) Patient is a 23 y.o. female presenting with chest pain and leg pain. The history is provided by the patient.  Chest Pain Leg Pain  patient here complaining of palpitations x2 weeks. History of similar symptoms associated with anxiety. States that she has felt more anxious recently. No SI or HI. Denies any dyspnea or chest pain. She also notes lower extremity pain localized to her bilateral ankles that is worse when she walks around. Denies any leg swelling. No syncope or near-syncope. Symptoms better when she relaxes. No treatment used prior to arrival.  Past Medical History  Diagnosis Date  . Hypertension   . Obesity   . Anxiety   . Asthma    History reviewed. No pertinent past surgical history. History reviewed. No pertinent family history. History  Substance Use Topics  . Smoking status: Former Games developermoker  . Smokeless tobacco: Never Used  . Alcohol Use: No     Comment: occassionally   OB History   Grav Para Term Preterm Abortions TAB SAB Ect Mult Living                 Review of Systems  Cardiovascular: Positive for chest pain.  All other systems reviewed and are negative.     Allergies  Review of patient's allergies indicates no known allergies.  Home Medications   Prior to Admission medications   Medication Sig Start Date End Date Taking? Authorizing Provider  methocarbamol (ROBAXIN) 500 MG tablet Take 1 tablet (500 mg total) by mouth 3 (three) times daily between meals as needed. 02/20/13   Rolland PorterMark James, MD  naproxen (NAPROSYN) 500 MG tablet Take 1 tablet (500 mg total) by mouth 2 (two) times daily. 02/20/13   Rolland PorterMark James, MD   BP 132/78  Pulse 66  Temp(Src) 98.3 F (36.8 C) (Oral)   Resp 18  Ht 5\' 7"  (1.702 m)  Wt 295 lb (133.811 kg)  BMI 46.19 kg/m2  SpO2 100%  LMP 07/15/2013 Physical Exam  Nursing note and vitals reviewed. Constitutional: She is oriented to person, place, and time. She appears well-developed and well-nourished.  Non-toxic appearance. No distress.  HENT:  Head: Normocephalic and atraumatic.  Eyes: Conjunctivae, EOM and lids are normal. Pupils are equal, round, and reactive to light.  Neck: Normal range of motion. Neck supple. No tracheal deviation present. No mass present.  Cardiovascular: Normal rate, regular rhythm and normal heart sounds.  Exam reveals no gallop.   No murmur heard. Pulmonary/Chest: Effort normal and breath sounds normal. No stridor. No respiratory distress. She has no decreased breath sounds. She has no wheezes. She has no rhonchi. She has no rales.  Abdominal: Soft. Normal appearance and bowel sounds are normal. She exhibits no distension. There is no tenderness. There is no rebound and no CVA tenderness.  Musculoskeletal: Normal range of motion. She exhibits no edema and no tenderness.  Neurological: She is alert and oriented to person, place, and time. She has normal strength. No cranial nerve deficit or sensory deficit. GCS eye subscore is 4. GCS verbal subscore is 5. GCS motor subscore is 6.  Skin: Skin is warm and dry. No abrasion and no rash noted.  Psychiatric: She has a normal mood and affect.  Her speech is normal and behavior is normal.    ED Course  Procedures (including critical care time) Labs Review Labs Reviewed - No data to display  Imaging Review No results found.   EKG Interpretation None      MDM   Final diagnoses:  None      Rate: 65   Rhythm: normal sinus rhythm  QRS Axis: normal  Intervals: normal  ST/T Wave abnormalities: normal  Conduction Disutrbances:none  Narrative Interpretation:   Old EKG Reviewed: none available   Patient's evaluation here reassuring. Suspect that this was  anxiety. No concern for DVT or PE. Stable for discharge  Toy BakerAnthony T Xochilth Standish, MD 08/22/13 1141

## 2013-08-22 NOTE — Discharge Instructions (Signed)
Panic Attacks °Panic attacks are sudden, short-lived surges of severe anxiety, fear, or discomfort. They may occur for no reason when you are relaxed, when you are anxious, or when you are sleeping. Panic attacks may occur for a number of reasons:  °· Healthy people occasionally have panic attacks in extreme, life-threatening situations, such as war or natural disasters. Normal anxiety is a protective mechanism of the body that helps us react to danger (fight or flight response). °· Panic attacks are often seen with anxiety disorders, such as panic disorder, social anxiety disorder, generalized anxiety disorder, and phobias. Anxiety disorders cause excessive or uncontrollable anxiety. They may interfere with your relationships or other life activities. °· Panic attacks are sometimes seen with other mental illnesses such as depression and posttraumatic stress disorder. °· Certain medical conditions, prescription medicines, and drugs of abuse can cause panic attacks. °SYMPTOMS  °Panic attacks start suddenly, peak within 20 minutes, and are accompanied by four or more of the following symptoms: °· Pounding heart or fast heart rate (palpitations). °· Sweating. °· Trembling or shaking. °· Shortness of breath or feeling smothered. °· Feeling choked. °· Chest pain or discomfort. °· Nausea or strange feeling in your stomach. °· Dizziness, lightheadedness, or feeling like you will faint. °· Chills or hot flushes. °· Numbness or tingling in your lips or hands and feet. °· Feeling that things are not real or feeling that you are not yourself. °· Fear of losing control or going crazy. °· Fear of dying. °Some of these symptoms can mimic serious medical conditions. For example, you may think you are having a heart attack. Although panic attacks can be very scary, they are not life threatening. °DIAGNOSIS  °Panic attacks are diagnosed through an assessment by your health care provider. Your health care provider will ask questions  about your symptoms, such as where and when they occurred. Your health care provider will also ask about your medical history and use of alcohol and drugs, including prescription medicines. Your health care provider may order blood tests or other studies to rule out a serious medical condition. Your health care provider may refer you to a mental health professional for further evaluation. °TREATMENT  °· Most healthy people who have one or two panic attacks in an extreme, life-threatening situation will not require treatment. °· The treatment for panic attacks associated with anxiety disorders or other mental illness typically involves counseling with a mental health professional, medicine, or a combination of both. Your health care provider will help determine what treatment is best for you. °· Panic attacks due to physical illness usually goes away with treatment of the illness. If prescription medicine is causing panic attacks, talk with your health care provider about stopping the medicine, decreasing the dose, or substituting another medicine. °· Panic attacks due to alcohol or drug abuse goes away with abstinence. Some adults need professional help in order to stop drinking or using drugs. °HOME CARE INSTRUCTIONS  °· Take all your medicines as prescribed.   °· Check with your health care provider before starting new prescription or over-the-counter medicines. °· Keep all follow up appointments with your health care provider. °SEEK MEDICAL CARE IF: °· You are not able to take your medicines as prescribed. °· Your symptoms do not improve or get worse. °SEEK IMMEDIATE MEDICAL CARE IF:  °· You experience panic attack symptoms that are different than your usual symptoms. °· You have serious thoughts about hurting yourself or others. °· You are taking medicine for panic attacks and   have a serious side effect. MAKE SURE YOU:  Understand these instructions.  Will watch your condition.  Will get help right away  if you are not doing well or get worse. Document Released: 04/07/2005 Document Revised: 01/26/2013 Document Reviewed: 11/19/2012 [facility] Patient Information 2014 [place], [place]. Muscle Strain A muscle strain is an injury that occurs when a muscle is stretched beyond its normal length. Usually a small number of muscle fibers are torn when this happens. Muscle strain is rated in degrees. First-degree strains have the least amount of muscle fiber tearing and pain. Second-degree and third-degree strains have increasingly more tearing and pain.  Usually, recovery from muscle strain takes 1 2 weeks. Complete healing takes 5 6 weeks.  CAUSES  Muscle strain happens when a sudden, violent force placed on a muscle stretches it too far. This may occur with lifting, sports, or a fall.  RISK FACTORS Muscle strain is especially common in athletes.  SIGNS AND SYMPTOMS At the site of the muscle strain, there may be:  Pain.  Bruising.  Swelling.  Difficulty using the muscle due to pain or lack of normal function. DIAGNOSIS  Your health care provider will perform a physical exam and ask about your medical history. TREATMENT  Often, the best treatment for a muscle strain is resting, icing, and applying cold compresses to the injured area.  HOME CARE INSTRUCTIONS   Use the PRICE method of treatment to promote muscle healing during the first 2 3 days after your injury. The PRICE method involves:  Protecting the muscle from being injured again.  Restricting your activity and resting the injured body part.  Icing your injury. To do this, put ice in a plastic bag. Place a towel between your skin and the bag. Then, apply the ice and leave it on from 15 20 minutes each hour. After the third day, switch to moist heat packs.  Apply compression to the injured area with a splint or elastic bandage. Be careful not to wrap it too tightly. This may interfere with blood circulation or increase  swelling.  Elevate the injured body part above the level of your heart as often as you can.  Only take over-the-counter or prescription medicines for pain, discomfort, or fever as directed by your health care provider.  Warming up prior to exercise helps to prevent future muscle strains. SEEK MEDICAL CARE IF:   You have increasing pain or swelling in the injured area.  You have numbness, tingling, or a significant loss of strength in the injured area. MAKE SURE YOU:   Understand these instructions.  Will watch your condition.  Will get help right away if you are not doing well or get worse. Document Released: 04/07/2005 Document Revised: 01/26/2013 Document Reviewed: 11/04/2012 [facility] Patient Information 2014 [place], [place].

## 2013-08-22 NOTE — ED Notes (Signed)
Pt here for palpitations, and red spot on left foot that started this am, pt concerned for blood clot.

## 2014-05-08 ENCOUNTER — Emergency Department (HOSPITAL_BASED_OUTPATIENT_CLINIC_OR_DEPARTMENT_OTHER)
Admission: EM | Admit: 2014-05-08 | Discharge: 2014-05-08 | Disposition: A | Discharge status: Home / Self Care | Payer: Self-pay | Attending: Emergency Medicine | Admitting: Emergency Medicine

## 2014-05-08 ENCOUNTER — Encounter (HOSPITAL_BASED_OUTPATIENT_CLINIC_OR_DEPARTMENT_OTHER): Payer: Self-pay | Admitting: Emergency Medicine

## 2014-05-08 DIAGNOSIS — F419 Anxiety disorder, unspecified: Secondary | ICD-10-CM | POA: Insufficient documentation

## 2014-05-08 DIAGNOSIS — R350 Frequency of micturition: Secondary | ICD-10-CM | POA: Insufficient documentation

## 2014-05-08 DIAGNOSIS — Z87891 Personal history of nicotine dependence: Secondary | ICD-10-CM | POA: Insufficient documentation

## 2014-05-08 DIAGNOSIS — M545 Low back pain, unspecified: Secondary | ICD-10-CM

## 2014-05-08 DIAGNOSIS — R300 Dysuria: Secondary | ICD-10-CM | POA: Insufficient documentation

## 2014-05-08 DIAGNOSIS — Z3202 Encounter for pregnancy test, result negative: Secondary | ICD-10-CM | POA: Insufficient documentation

## 2014-05-08 DIAGNOSIS — I10 Essential (primary) hypertension: Secondary | ICD-10-CM | POA: Insufficient documentation

## 2014-05-08 DIAGNOSIS — E669 Obesity, unspecified: Secondary | ICD-10-CM | POA: Insufficient documentation

## 2014-05-08 DIAGNOSIS — J45909 Unspecified asthma, uncomplicated: Secondary | ICD-10-CM | POA: Insufficient documentation

## 2014-05-08 LAB — URINALYSIS, ROUTINE W REFLEX MICROSCOPIC
BILIRUBIN URINE: NEGATIVE
Glucose, UA: NEGATIVE mg/dL
Hgb urine dipstick: NEGATIVE
Ketones, ur: NEGATIVE mg/dL
Leukocytes, UA: NEGATIVE
Nitrite: NEGATIVE
PH: 6 (ref 5.0–8.0)
Protein, ur: NEGATIVE mg/dL
SPECIFIC GRAVITY, URINE: 1.023 (ref 1.005–1.030)
Urobilinogen, UA: 0.2 mg/dL (ref 0.0–1.0)

## 2014-05-08 LAB — PREGNANCY, URINE: PREG TEST UR: NEGATIVE

## 2014-05-08 MED ORDER — TRAMADOL HCL 50 MG PO TABS: 50.0000 mg | ORAL_TABLET | Freq: Four times a day (QID) | ORAL | Status: DC | PRN

## 2014-05-08 NOTE — ED Provider Notes (Signed)
CSN: [id number]     Arrival date & time 05/08/14  [id number] History   First MD Initiated Contact with Patient 05/08/14 [phone]     Chief Complaint  Patient presents with  . Abdominal Pain     (Consider location/radiation/quality/duration/timing/severity/associated sxs/prior Treatment) HPI Comments: Pt comes in with c/o lower abdominal pressure and some lower back pain times 1 week. She has also had urinary frequency. Denies fever, n/v/d, vaginal discharge. Hasn't taken anything for the symptoms. She states that she has been drinking a tea that is supposed to help you loose wt and that is when the symptoms started.  The history is provided by the patient. No language interpreter was used.    Past Medical History  Diagnosis Date  . Hypertension   . Obesity   . Anxiety   . Asthma    History reviewed. No pertinent past surgical history. No family history on file. History  Substance Use Topics  . Smoking status: Former Games developermoker  . Smokeless tobacco: Never Used  . Alcohol Use: No     Comment: occassionally   OB History    No data available     Review of Systems  All other systems reviewed and are negative.     Allergies  Review of patient's allergies indicates no known allergies.  Home Medications   Prior to Admission medications   Medication Sig Start Date End Date Taking? Authorizing Provider  acetaminophen (TYLENOL) 325 MG tablet Take 650 mg by mouth every 6 (six) hours as needed for moderate pain.    Historical Provider, MD  ALPRAZolam Prudy Feeler(XANAX) 0.25 MG tablet Take 1 tablet (0.25 mg total) by mouth 2 (two) times daily as needed for anxiety. 08/22/13   Toy BakerAnthony T Allen, MD  ibuprofen (ADVIL,MOTRIN) 600 MG tablet Take 1 tablet (600 mg total) by mouth every 6 (six) hours as needed. 08/22/13   Toy BakerAnthony T Allen, MD   BP 147/85 mmHg  Pulse 72  Temp(Src) 98.5 F (36.9 C) (Oral)  Resp 20  Ht 5\' 7"  (1.702 m)  Wt 306 lb (138.801 kg)  BMI 47.92 kg/m2  SpO2 100%  LMP 04/15/2014 Physical  Exam  Constitutional: She is oriented to person, place, and time. She appears well-developed and well-nourished.  Cardiovascular: Normal rate and regular rhythm.   Pulmonary/Chest: Effort normal and breath sounds normal.  Abdominal: Soft. Bowel sounds are normal. There is no tenderness.  Musculoskeletal: Normal range of motion.  Neurological: She is alert and oriented to person, place, and time. Coordination normal.  Skin: Skin is warm and dry.  Psychiatric: She has a normal mood and affect.  Nursing note and vitals reviewed.   ED Course  Procedures (including critical care time) Labs Review Labs Reviewed  URINALYSIS, ROUTINE W REFLEX MICROSCOPIC  PREGNANCY, URINE    Imaging Review No results found.   EKG Interpretation None      MDM   Final diagnoses:  Dysuria  Midline low back pain without sciatica    No infection noted. Will treat with pain medication. Abdomen is benign. Discussed return precautions with pt    Teressa LowerVrinda Jermany Sundell, NP 05/08/14 1059  Merrie RoofJohn David Wofford III, MD 05/08/14 1626

## 2014-05-08 NOTE — ED Notes (Signed)
abd and back pain X 1 week with frequent urination, no V/D, A/O X4, ambulatory and in NAD

## 2014-05-08 NOTE — Discharge Instructions (Signed)

## 2015-03-13 IMAGING — CR DG CHEST 2V
2 series · 2 of 2 positions shown · non-contrast
Comparison: PA and lateral chest 02/20/2013.

CLINICAL DATA: Cough.  Chest palpitations.

EXAM:
CHEST  2 VIEW

[w chest pa]
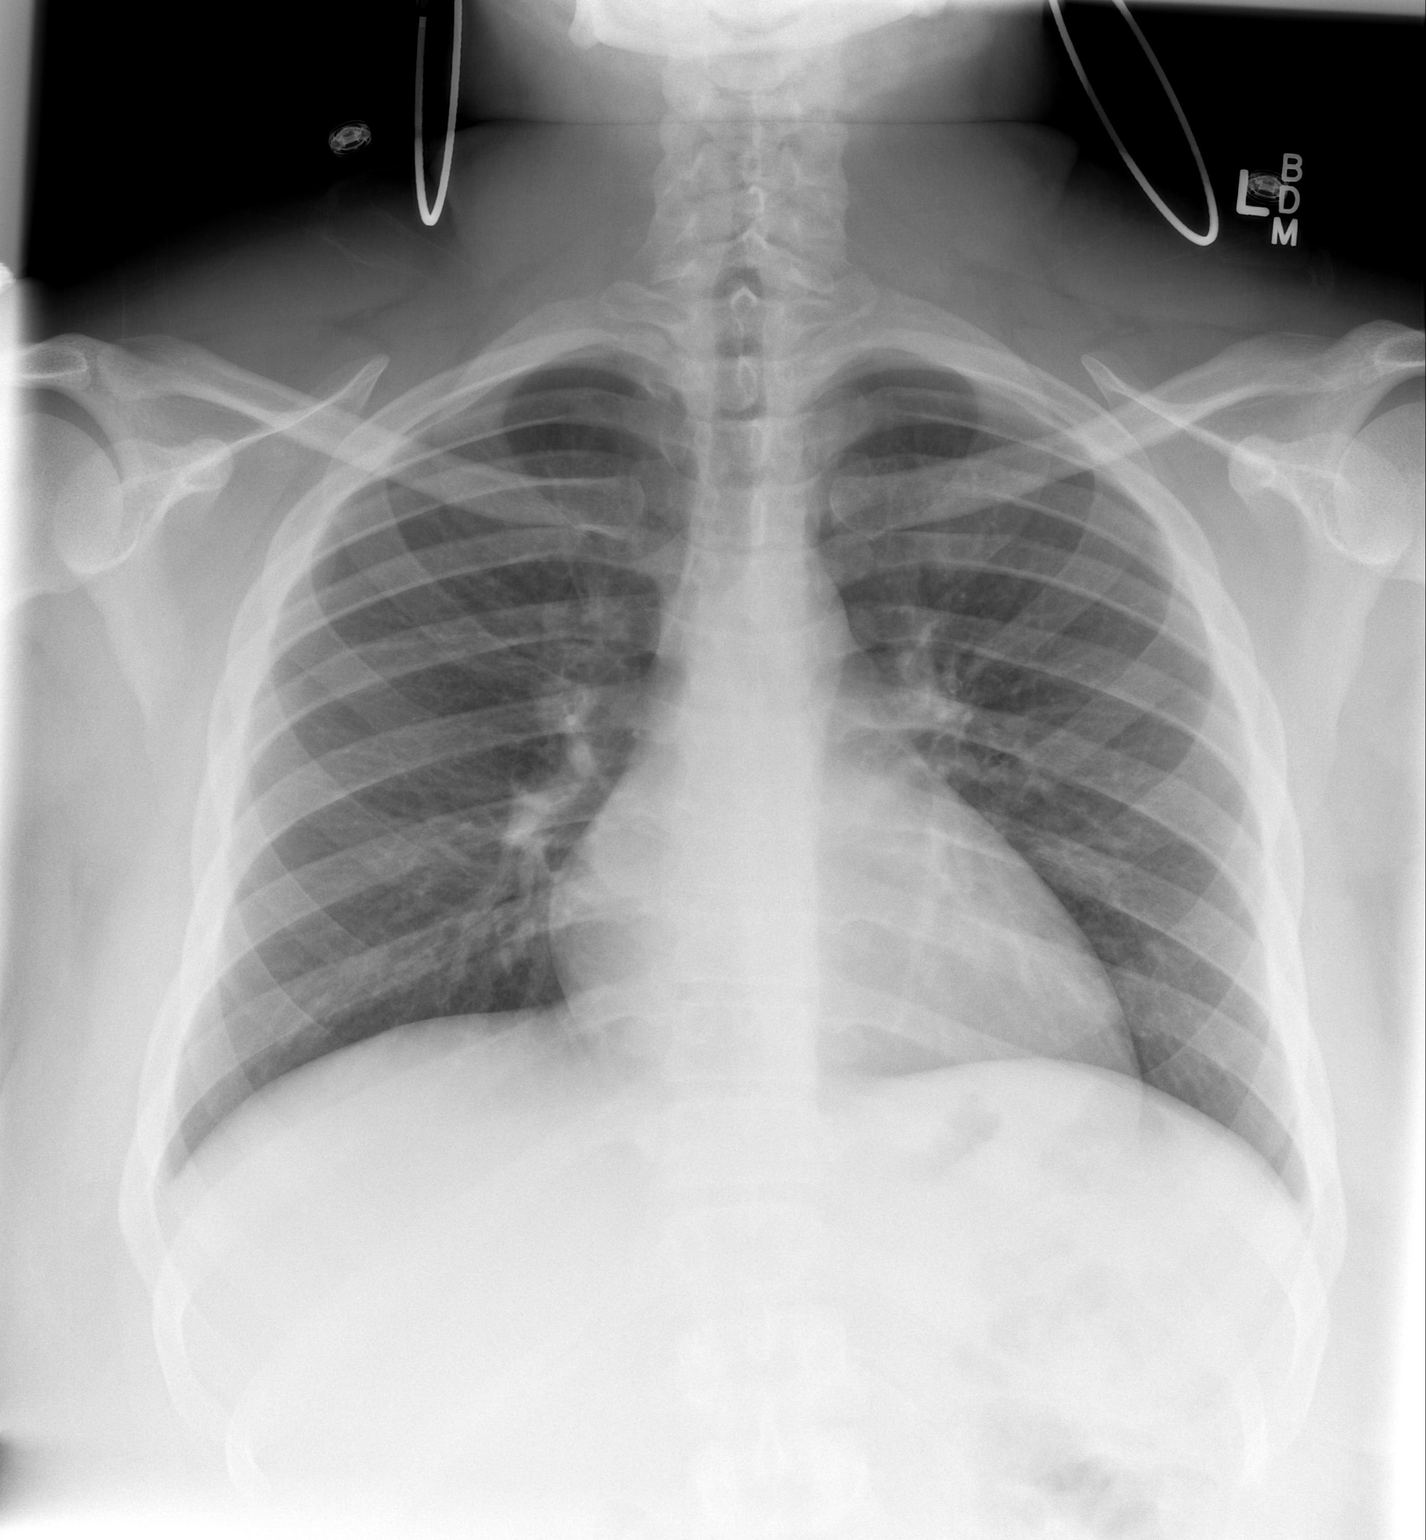

[w chest lat *]
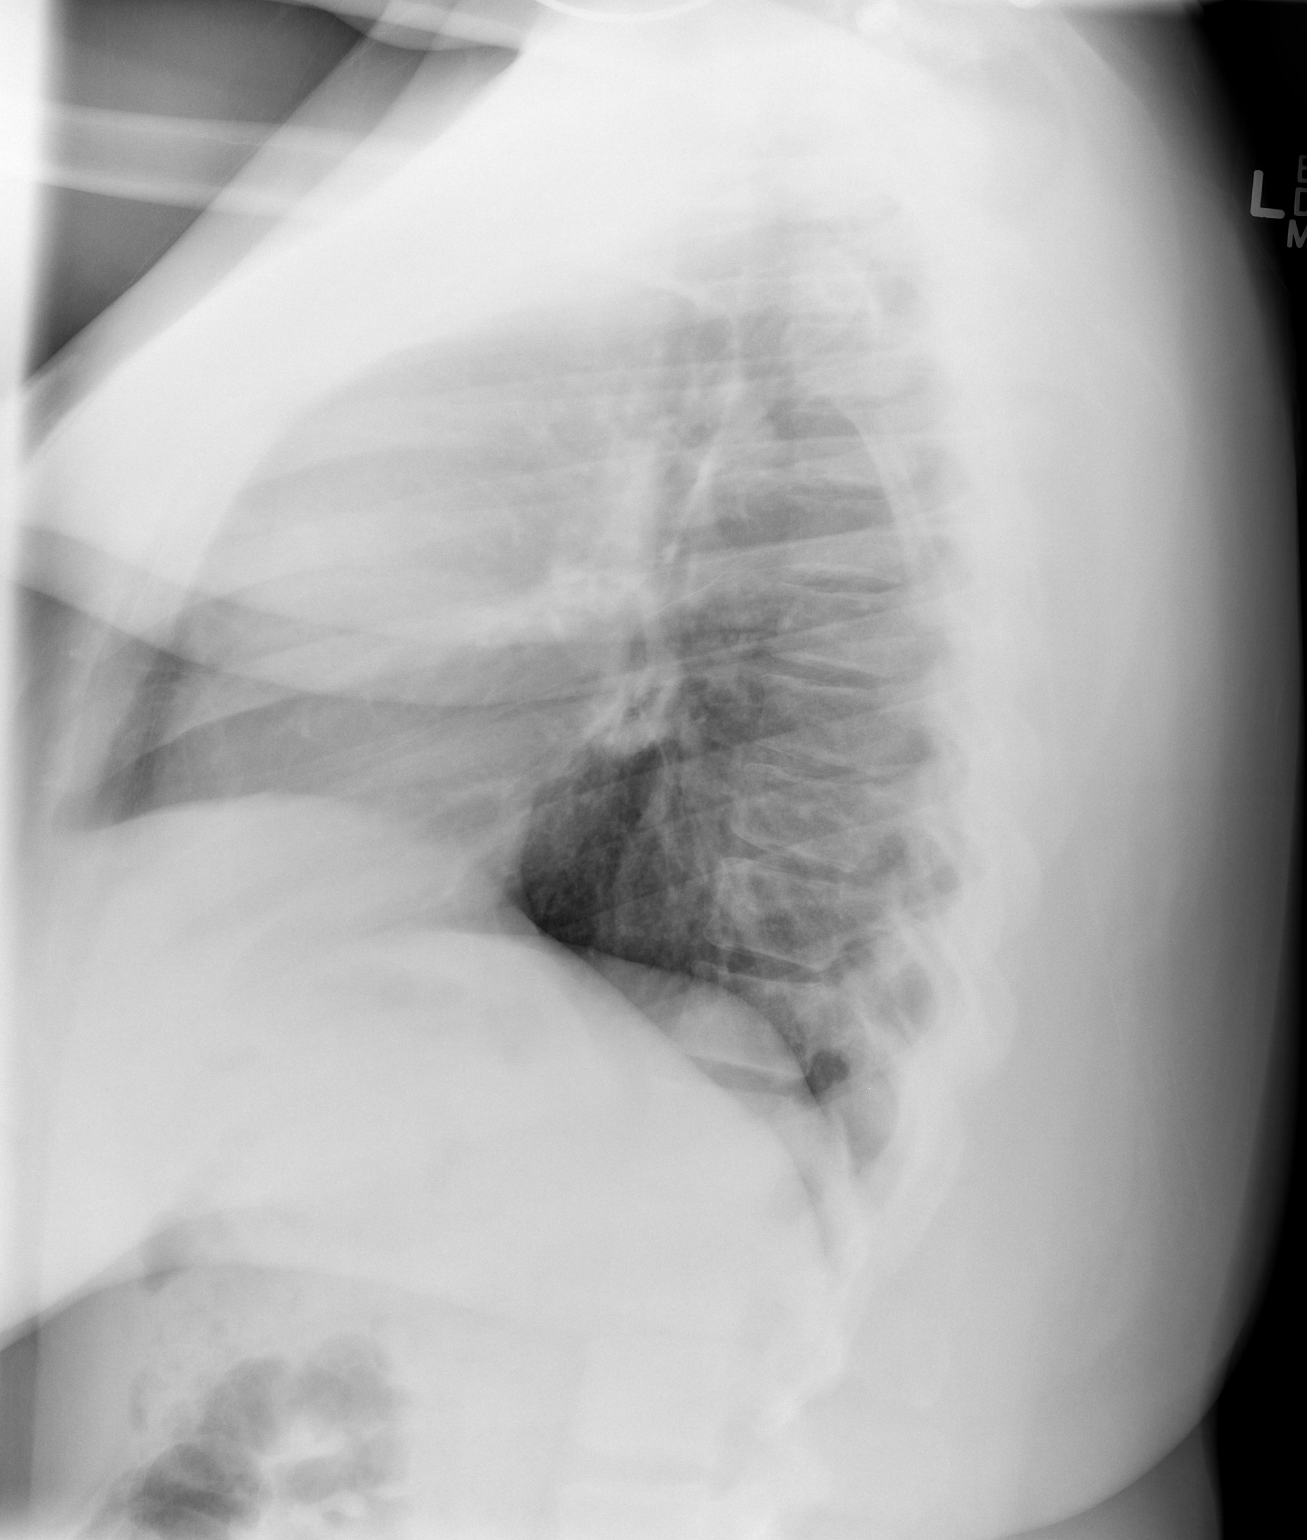

[2 of 2 positions shown; findings below may reference images not displayed]

FINDINGS: The lungs are clear. Heart size is normal. There is no pneumothorax
or pleural effusion. No focal bony abnormality is identified.
IMPRESSION: Negative chest.

## 2017-03-28 ENCOUNTER — Other Ambulatory Visit: Payer: Self-pay

## 2017-03-28 ENCOUNTER — Encounter (HOSPITAL_COMMUNITY): Payer: Self-pay | Admitting: Emergency Medicine

## 2017-03-28 ENCOUNTER — Emergency Department (HOSPITAL_COMMUNITY)
Admission: EM | Admit: 2017-03-28 | Discharge: 2017-03-28 | Disposition: A | Discharge status: Home / Self Care | Payer: Self-pay | Attending: Emergency Medicine | Admitting: Emergency Medicine

## 2017-03-28 DIAGNOSIS — Z5321 Procedure and treatment not carried out due to patient leaving prior to being seen by health care provider: Secondary | ICD-10-CM | POA: Insufficient documentation

## 2017-03-28 DIAGNOSIS — M79605 Pain in left leg: Secondary | ICD-10-CM | POA: Insufficient documentation

## 2017-03-28 NOTE — ED Triage Notes (Signed)
Pt from home with c/o left leg pain with walking x 3 days. Pt states it is a deep shooting pain that shoots from her back down to her leg. Pt denies injury, swelling, or redness. Pt states she has also been caring for a friend with the flu and she has been feeling intermittent dizziness and nausea x 2 days.

## 2018-02-24 ENCOUNTER — Other Ambulatory Visit: Payer: Self-pay

## 2018-02-24 ENCOUNTER — Emergency Department (HOSPITAL_COMMUNITY): Payer: BLUE CROSS/BLUE SHIELD

## 2018-02-24 ENCOUNTER — Emergency Department (HOSPITAL_COMMUNITY)
Admission: EM | Admit: 2018-02-24 | Discharge: 2018-02-25 | Disposition: A | Discharge status: Home / Self Care | Payer: BLUE CROSS/BLUE SHIELD | Attending: Emergency Medicine | Admitting: Emergency Medicine

## 2018-02-24 ENCOUNTER — Encounter (HOSPITAL_COMMUNITY): Payer: Self-pay

## 2018-02-24 DIAGNOSIS — K863 Pseudocyst of pancreas: Secondary | ICD-10-CM | POA: Diagnosis not present

## 2018-02-24 DIAGNOSIS — R1013 Epigastric pain: Secondary | ICD-10-CM

## 2018-02-24 DIAGNOSIS — I10 Essential (primary) hypertension: Secondary | ICD-10-CM | POA: Diagnosis not present

## 2018-02-24 DIAGNOSIS — J45909 Unspecified asthma, uncomplicated: Secondary | ICD-10-CM | POA: Diagnosis not present

## 2018-02-24 DIAGNOSIS — Z87891 Personal history of nicotine dependence: Secondary | ICD-10-CM | POA: Diagnosis not present

## 2018-02-24 DIAGNOSIS — R1084 Generalized abdominal pain: Secondary | ICD-10-CM | POA: Diagnosis present

## 2018-02-24 LAB — CBC
HEMATOCRIT: 34.2 % — AB (ref 36.0–46.0)
HEMOGLOBIN: 10.7 g/dL — AB (ref 12.0–15.0)
MCH: 27 pg (ref 26.0–34.0)
MCHC: 31.3 g/dL (ref 30.0–36.0)
MCV: 86.4 fL (ref 80.0–100.0)
Platelets: 388 10*3/uL (ref 150–400)
RBC: 3.96 MIL/uL (ref 3.87–5.11)
RDW: 13.7 % (ref 11.5–15.5)
WBC: 7.5 10*3/uL (ref 4.0–10.5)
nRBC: 0 % (ref 0.0–0.2)

## 2018-02-24 LAB — LIPASE, BLOOD: Lipase: 22 U/L (ref 11–51)

## 2018-02-24 LAB — URINALYSIS, ROUTINE W REFLEX MICROSCOPIC
BILIRUBIN URINE: NEGATIVE
Glucose, UA: NEGATIVE mg/dL
Hgb urine dipstick: NEGATIVE
KETONES UR: 20 mg/dL — AB
Leukocytes, UA: NEGATIVE
Nitrite: NEGATIVE
PH: 7 (ref 5.0–8.0)
Protein, ur: NEGATIVE mg/dL
SPECIFIC GRAVITY, URINE: 1.024 (ref 1.005–1.030)

## 2018-02-24 LAB — COMPREHENSIVE METABOLIC PANEL
ALT: 16 U/L (ref 0–44)
AST: 18 U/L (ref 15–41)
Albumin: 3.9 g/dL (ref 3.5–5.0)
Alkaline Phosphatase: 64 U/L (ref 38–126)
Anion gap: 8 (ref 5–15)
BUN: 11 mg/dL (ref 6–20)
CHLORIDE: 105 mmol/L (ref 98–111)
CO2: 24 mmol/L (ref 22–32)
Calcium: 9.3 mg/dL (ref 8.9–10.3)
Creatinine, Ser: 0.78 mg/dL (ref 0.44–1.00)
GFR calc non Af Amer: >60 mL/min (ref >60)
Glucose, Bld: 103 mg/dL — ABNORMAL HIGH (ref 70–99)
Potassium: 4.2 mmol/L (ref 3.5–5.1)
SODIUM: 137 mmol/L (ref 135–145)
Total Bilirubin: 0.5 mg/dL (ref 0.3–1.2)
Total Protein: 7.4 g/dL (ref 6.5–8.1)

## 2018-02-24 LAB — I-STAT BETA HCG BLOOD, ED (MC, WL, AP ONLY): I-stat hCG, quantitative: <5 m[IU]/mL (ref <5)

## 2018-02-24 MED ORDER — ONDANSETRON HCL 4 MG/2ML IJ SOLN
4.0000 mg | Freq: Once | INTRAMUSCULAR | Status: AC
  Administered 2018-02-24: 4 mg via INTRAVENOUS
  Filled 2018-02-24: qty 2

## 2018-02-24 MED ORDER — FENTANYL CITRATE (PF) 100 MCG/2ML IJ SOLN
25.0000 ug | Freq: Once | INTRAMUSCULAR | Status: AC
  Administered 2018-02-24: 25 ug via INTRAVENOUS
  Filled 2018-02-24: qty 2

## 2018-02-24 MED ORDER — HYDROCODONE-ACETAMINOPHEN 5-325 MG PO TABS: 1.0000 | ORAL_TABLET | ORAL | 0 refills | Status: AC | PRN

## 2018-02-24 MED ORDER — SODIUM CHLORIDE 0.9 % IV BOLUS
1000.0000 mL | Freq: Once | INTRAVENOUS | Status: AC
  Administered 2018-02-24: 1000 mL via INTRAVENOUS

## 2018-02-24 MED ORDER — HYDROMORPHONE HCL 1 MG/ML IJ SOLN
1.0000 mg | Freq: Once | INTRAMUSCULAR | Status: AC
  Administered 2018-02-24: 1 mg via INTRAVENOUS
  Filled 2018-02-24: qty 1

## 2018-02-24 MED ORDER — IOPAMIDOL (ISOVUE-300) INJECTION 61%
INTRAVENOUS | Status: AC
  Filled 2018-02-24: qty 100

## 2018-02-24 MED ORDER — SODIUM CHLORIDE (PF) 0.9 % IJ SOLN
INTRAMUSCULAR | Status: AC
Start: 2018-02-24 — End: 2018-02-24
  Administered 2018-02-24: 21:00:00
  Filled 2018-02-24: qty 50

## 2018-02-24 MED ORDER — IOPAMIDOL (ISOVUE-300) INJECTION 61%
100.0000 mL | Freq: Once | INTRAVENOUS | Status: AC | PRN
  Administered 2018-02-24: 100 mL via INTRAVENOUS

## 2018-02-24 NOTE — ED Notes (Signed)
Patient provided with mouth swabs and warm blankets. Denies any further needs at this time.

## 2018-02-24 NOTE — Discharge Instructions (Signed)
I have provided a referral for gastroenterology, please call to schedule an ointment for further evaluation of your pseudocyst.  I have also provided pain medication for you please take this for severe pain as needed.  You experience any worsening symptoms, nausea, vomiting please return to the ED for reevaluation.

## 2018-02-24 NOTE — ED Notes (Signed)
Once roomed, pt was asked to provide a urine sample.  First attempt unsuccessful, pt sts "I tried but I can't go right now."  RN notified.

## 2018-02-24 NOTE — ED Provider Notes (Signed)
[facility]-EMERGENCY DEPT Provider Note   CSN: [id number] Arrival date & time: 02/24/18  1229     History   Chief Complaint Chief Complaint  Patient presents with  . Abdominal Pain    HPI Tammie Thomas is a 27 y.o. female.  27 y/o female with a PMH of HTN,GERD presents to the ED sent in by her PCP with a chief complaint of abdominal pain since this morning. She describes this pain as a constant crampy feeling along her whole abdomen. She reports being seen by her PCP this morning and during the abdominal exam patient had one episode of vomiting. She was referred to the ED for further evaluation. She is having a previous history of GERD when she was 27 years old, she reports having spicy sausage last night along with pasta sauce and a buffalo wrap for lunch. She also reports nausea. She tried taking tylenol for her pain but reports no relieve in symptoms. During her ED visit patient reports 3 episodes of diarrhea. Patient denies any previous surgical history in her abdomen. She denies any fever, chest pain, shortness of breath or urinary complaints.   The history is provided by the patient.    Past Medical History:  Diagnosis Date  . Anxiety   . Asthma   . Hypertension   . Obesity     There are no active problems to display for this patient.   History reviewed. No pertinent surgical history.   OB History   None      Home Medications    Prior to Admission medications   Medication Sig Start Date End Date Taking? Authorizing Provider  acetaminophen (TYLENOL) 325 MG tablet Take 650 mg by mouth every 6 (six) hours as needed for moderate pain.   Yes [provider]  ALPRAZolam (XANAX) 0.25 MG tablet Take 1 tablet (0.25 mg total) by mouth 2 (two) times daily as needed for anxiety. Patient not taking: Reported on 02/24/2018 08/22/13   Lorre Nick, MD  HYDROcodone-acetaminophen (NORCO/VICODIN) 5-325 MG tablet Take 1 tablet by mouth every 4 (four)  hours as needed for up to 5 days. 02/24/18 03/01/18  Claude Manges, PA-C  ibuprofen (ADVIL,MOTRIN) 600 MG tablet Take 1 tablet (600 mg total) by mouth every 6 (six) hours as needed. Patient not taking: Reported on 02/24/2018 08/22/13   Lorre Nick, MD  traMADol (ULTRAM) 50 MG tablet Take 1 tablet (50 mg total) by mouth every 6 (six) hours as needed. Patient not taking: Reported on 02/24/2018 05/08/14   Teressa Lower, NP    Family History No family history on file.  Social History Social History   Tobacco Use  . Smoking status: Former Smoker    Last attempt to quit: 02/25/2008    Years since quitting: 10.0  . Smokeless tobacco: Never Used  Substance Use Topics  . Alcohol use: No  . Drug use: No     Allergies   Patient has no known allergies.   Review of Systems Review of Systems  Constitutional: Negative for chills and fever.  HENT: Negative for sore throat.   Respiratory: Negative for shortness of breath.   Cardiovascular: Negative for chest pain.  Gastrointestinal: Positive for abdominal pain, diarrhea, nausea and vomiting.  Genitourinary: Negative for dysuria and flank pain.  Musculoskeletal: Negative for back pain.  Skin: Negative for pallor and wound.  Neurological: Negative for light-headedness and headaches.     Physical Exam Updated Vital Signs BP (!) 142/75 (BP Location: Right Arm)  Pulse 75   Temp 97.9 F (36.6 C) (Oral)   Resp 18   Ht 5\' 7"  (1.702 m)   Wt (!) 154.2 kg   LMP 02/09/2018   SpO2 100%   BMI 53.25 kg/m   Physical Exam  Constitutional: She is oriented to person, place, and time. She appears well-developed and well-nourished. No distress.  HENT:  Head: Normocephalic and atraumatic.  Mouth/Throat: Oropharynx is clear and moist. No oropharyngeal exudate.  Eyes: Pupils are equal, round, and reactive to light.  Neck: Normal range of motion.  Cardiovascular: Regular rhythm and normal heart sounds.  Pulmonary/Chest: Effort normal and breath  sounds normal. No respiratory distress.  Abdominal: Soft. She exhibits no distension and no ascites. Bowel sounds are decreased. There is generalized tenderness and tenderness in the right upper quadrant, epigastric area and periumbilical area. There is no rigidity, no rebound, no guarding, no CVA tenderness, no tenderness at McBurney's point and negative Murphy's sign.  Musculoskeletal: She exhibits no tenderness or deformity.       Right lower leg: She exhibits no edema.       Left lower leg: She exhibits no edema.  Neurological: She is alert and oriented to person, place, and time.  Skin: Skin is warm and dry.  Psychiatric: She has a normal mood and affect.  Nursing note and vitals reviewed.    ED Treatments / Results  Labs (all labs ordered are listed, but only abnormal results are displayed) Labs Reviewed  COMPREHENSIVE METABOLIC PANEL - Abnormal; Notable for the following components:      Result Value   Glucose, Bld 103 (*)    All other components within normal limits  URINALYSIS, ROUTINE W REFLEX MICROSCOPIC - Abnormal; Notable for the following components:   Ketones, ur 20 (*)    All other components within normal limits  LIPASE, BLOOD  CBC  CBC WITH DIFFERENTIAL/PLATELET  I-STAT BETA HCG BLOOD, ED ([facility], [facility], [facility] ONLY)    EKG None  Radiology Ct Abdomen Pelvis W Contrast  Result Date: 02/24/2018 CLINICAL DATA:  Generalized abdominal pain, nausea and vomiting beginning this morning. EXAM: CT ABDOMEN AND PELVIS WITH CONTRAST TECHNIQUE: Multidetector CT imaging of the abdomen and pelvis was performed using the standard protocol following bolus administration of intravenous contrast. CONTRAST:  ISOVUE-300 IOPAMIDOL (ISOVUE-300) INJECTION 61% COMPARISON:  RIGHT upper quadrant ultrasound February 24, 2018 FINDINGS: LOWER CHEST: Lung bases are clear. Included heart size is normal. No pericardial effusion. HEPATOBILIARY: Liver and gallbladder are normal. PANCREAS: 2.8 x 2.9 cm  cyst (-4 Hounsfield units) within tail of the pancreas with mild surrounding inflammatory changes, no discrete capsular enhancement. No calcifications or ductal dilatation. SPLEEN: Normal. ADRENALS/URINARY TRACT: Kidneys are orthotopic, demonstrating symmetric enhancement. No nephrolithiasis, hydronephrosis or solid renal masses. The unopacified ureters are normal in course and caliber. Urinary bladder is partially distended and unremarkable. Normal adrenal glands. STOMACH/BOWEL: The stomach, small and large bowel are normal in course and caliber without inflammatory changes. Normal appendix. VASCULAR/LYMPHATIC: Aortoiliac vessels are normal in course and caliber. No lymphadenopathy by CT size criteria. REPRODUCTIVE: Normal. OTHER: No intraperitoneal free fluid or free air. MUSCULOSKELETAL: Nonacute. IMPRESSION: 1. 2.8 x 2.9 cm inflamed cyst in tail of pancreas seen with pancreatitis and pseudocyst, or pancreatic cystic neoplasm. Recommend non emergent MRI of the pancreas with contrast for further evaluation. Surgical consultation may be indicated. 2. Acute findings discussed with and reconfirmed by PA Alante Weimann on 02/24/2018 at 9:51 pm. Electronically Signed   By: Michel Santee.D.  On: 02/24/2018 21:52   US Abdomen Limited  Result Date: 02/24/2018 CLINICAL DATA:  RIGHT upper quadrant pain beginning earlier today. EXAM: ULTRASOUND ABDOMEN LIMITED RIGHT UPPER QUADRANT COMPARISON:  None. FINDINGS: Gallbladder: No gallstones or wall thickening visualized. No sonographic Murphy sign noted by sonographer. Common bile duct: Diameter: 4 mm Liver: No focal lesion identified. Increased parenchymal echogenicity. Portal vein is patent on color Doppler imaging with normal direction of blood flow towards the liver. IMPRESSION: 1. No acute RIGHT upper quadrant process. 2. Mild hepatic steatosis. Electronically Signed   By: Awilda Metro M.D.   On: 02/24/2018 19:19    Procedures Procedures (including critical care  time)  Medications Ordered in ED Medications  sodium chloride 0.9 % bolus 1,000 mL (0 mLs Intravenous Stopped 02/24/18 1959)  ondansetron (ZOFRAN) injection 4 mg (4 mg Intravenous Given 02/24/18 1842)  fentaNYL (SUBLIMAZE) injection 25 mcg (25 mcg Intravenous Given 02/24/18 1842)  fentaNYL (SUBLIMAZE) injection 25 mcg (25 mcg Intravenous Given 02/24/18 1958)  ondansetron (ZOFRAN) injection 4 mg (4 mg Intravenous Given 02/24/18 2051)  HYDROmorphone (DILAUDID) injection 1 mg (1 mg Intravenous Given 02/24/18 2126)  iopamidol (ISOVUE-300) 61 % injection 100 mL (100 mLs Intravenous Contrast Given 02/24/18 2107)  sodium chloride (PF) 0.9 % injection (  Given by Other 02/24/18 2115)     Initial Impression / Assessment and Plan / ED Course  I have reviewed the triage vital signs and the nursing notes.  Pertinent labs & imaging results that were available during my care of the patient were reviewed by me and considered in my medical decision making (see chart for details).    Patient presents with abdominal pain which began morning.  Patient was sent by her PCP in order to rule out any acute cholecystitis cholelithiasis, cholangitis.  CMP showed no electrolyte abnormality, CBC was collected but never resulted.  Beta hCG was negative and lipase levels are reassuring.  UA showed no nitrates, leukocytes, white blood cell count.  Upper quadrant ultrasound showed no acute abnormality, mild hepatic steatosis.  Patient was obtained with a CT abdomen and pelvis.  CT abdomen showed: 1. 2.8 x 2.9 cm inflamed cyst in tail of pancreas seen with pancreatitis and pseudocyst, or pancreatic cystic neoplasm. Recommend non emergent MRI of the pancreas with contrast for further.  Consistent with patient's pain, well obtain a consult to GI for their recommendations.  Patient has received bolus while in the ED along with fentanyl, Dilaudid for pain.  Talked to Dr. Marina Goodell who advised patient will need to obtain a ultrasound  endoscopically but this can be done in a nonemergent setting as patient is currently stable and has good vital signs and her pain is controlled.  Discussed these options with patient patient would like to go home on occasion along with a referral for GI.  I will provide both of these for patients.  Prescription for Vicodin provided.  Patient is stable, vitals stable during ED visit.  Return precautions provided.  Final Clinical Impressions(s) / ED Diagnoses   Final diagnoses:  Epigastric pain    ED Discharge Orders         Ordered    HYDROcodone-acetaminophen (NORCO/VICODIN) 5-325 MG tablet  Every 4 hours PRN     02/24/18 2239           Claude Manges, PA-C 02/24/18 2250    Arby Barrette, MD 02/24/18 2313

## 2018-02-24 NOTE — ED Notes (Addendum)
Ultrasound in room

## 2018-02-24 NOTE — ED Notes (Signed)
Patient transported to CT 

## 2018-02-24 NOTE — ED Notes (Signed)
RN aware of pt elevated BP.  

## 2018-02-24 NOTE — ED Triage Notes (Signed)
Pt sent from [facility]. They were concerned for gall stones. Pt states that she started having abdominal pain early this morning and has thrown up 3 times. Pt denies diarrhea.

## 2018-02-25 LAB — DIFFERENTIAL
BASOS PCT: 0 %
Basophils Absolute: 0 10*3/uL (ref 0.0–0.1)
Eosinophils Absolute: 0 10*3/uL (ref 0.0–0.5)
Eosinophils Relative: 0 %
LYMPHS ABS: 1.2 10*3/uL (ref 0.7–4.0)
Lymphocytes Relative: 16 %
MONOS PCT: 3 %
Monocytes Absolute: 0.3 10*3/uL (ref 0.1–1.0)
NEUTROS ABS: 6 10*3/uL (ref 1.7–7.7)
Neutrophils Relative %: 80 %

## 2018-03-01 ENCOUNTER — Other Ambulatory Visit (INDEPENDENT_AMBULATORY_CARE_PROVIDER_SITE_OTHER): Payer: BLUE CROSS/BLUE SHIELD

## 2018-03-01 ENCOUNTER — Ambulatory Visit: Payer: BLUE CROSS/BLUE SHIELD | Admitting: Gastroenterology

## 2018-03-01 ENCOUNTER — Encounter: Payer: Self-pay | Admitting: Gastroenterology

## 2018-03-01 VITALS — BP 120/74 | HR 88 | Ht 67.0 in | Wt 345.1 lb

## 2018-03-01 DIAGNOSIS — R933 Abnormal findings on diagnostic imaging of other parts of digestive tract: Secondary | ICD-10-CM

## 2018-03-01 DIAGNOSIS — K862 Cyst of pancreas: Secondary | ICD-10-CM

## 2018-03-01 DIAGNOSIS — R1013 Epigastric pain: Secondary | ICD-10-CM

## 2018-03-01 DIAGNOSIS — K30 Functional dyspepsia: Secondary | ICD-10-CM | POA: Diagnosis not present

## 2018-03-01 LAB — CBC WITH DIFFERENTIAL/PLATELET
BASOS ABS: 0.1 10*3/uL (ref 0.0–0.1)
Basophils Relative: 1 % (ref 0.0–3.0)
Eosinophils Absolute: 0.1 10*3/uL (ref 0.0–0.7)
Eosinophils Relative: 1.5 % (ref 0.0–5.0)
HCT: 36.1 % (ref 36.0–46.0)
Hemoglobin: 12.2 g/dL (ref 12.0–15.0)
LYMPHS PCT: 37.6 % (ref 12.0–46.0)
Lymphs Abs: 2.7 10*3/uL (ref 0.7–4.0)
MCHC: 33.9 g/dL (ref 30.0–36.0)
MCV: 81.3 fl (ref 78.0–100.0)
MONOS PCT: 6 % (ref 3.0–12.0)
Monocytes Absolute: 0.4 10*3/uL (ref 0.1–1.0)
NEUTROS PCT: 53.9 % (ref 43.0–77.0)
Neutro Abs: 3.8 10*3/uL (ref 1.4–7.7)
Platelets: 443 10*3/uL — ABNORMAL HIGH (ref 150.0–400.0)
RBC: 4.44 Mil/uL (ref 3.87–5.11)
RDW: 14.2 % (ref 11.5–15.5)
WBC: 7.1 10*3/uL (ref 4.0–10.5)

## 2018-03-01 LAB — IGA: IGA: 333 mg/dL (ref 68–378)

## 2018-03-01 LAB — BASIC METABOLIC PANEL
BUN: 11 mg/dL (ref 6–23)
CO2: 26 mEq/L (ref 19–32)
Calcium: 9.8 mg/dL (ref 8.4–10.5)
Chloride: 104 mEq/L (ref 96–112)
Creatinine, Ser: 0.84 mg/dL (ref 0.40–1.20)
GFR: 103.11 mL/min (ref >60.00)
Glucose, Bld: 93 mg/dL (ref 70–99)
Potassium: 4 mEq/L (ref 3.5–5.1)
SODIUM: 139 meq/L (ref 135–145)

## 2018-03-01 LAB — LIPASE: Lipase: 6 U/L — ABNORMAL LOW (ref 11.0–59.0)

## 2018-03-01 LAB — AMYLASE: AMYLASE: 49 U/L (ref 27–131)

## 2018-03-01 LAB — TRIGLYCERIDES: TRIGLYCERIDES: 83 mg/dL (ref 0.0–149.0)

## 2018-03-01 MED ORDER — OMEPRAZOLE 40 MG PO CPDR: 40.0000 mg | DELAYED_RELEASE_CAPSULE | Freq: Every day | ORAL | 2 refills | Status: DC

## 2018-03-01 NOTE — Patient Instructions (Signed)
Your provider has requested that you go to the basement level for lab work before leaving today. Press "B" on the elevator. The lab is located at the first door on the left as you exit the elevator.  You will be scheduled for an MRI at [facility]. Please arrive 15 minutes prior to your appointment time for registration purposes. Please make certain not to have anything to eat or drink 6 hours prior to your test. In addition, if you have any metal in your body, have a pacemaker or defibrillator, please be sure to let your ordering physician know. This test typically takes 45 minutes to 1 hour to complete. Should you need to reschedule, please call [phone] to do so.  We have sent the following medications to your pharmacy for you to pick up at your convenience: Omeprazole 40 mg daily, do not start until AFTER you have returned your stool sample.

## 2018-03-01 NOTE — Progress Notes (Signed)
GASTROENTEROLOGY OUTPATIENT CLINIC VISIT   Primary Care Provider Patient, No Pcp Per No address on file None  Referring Provider No referring provider defined for this encounter.   Patient Profile: Tammie Thomas is a 27 y.o. female with a pmh significant for Morbid Obesity, HTN, Asthma, Anxiety.  The patient presents to the [facility] for an evaluation and management of problem(s) noted below:  Problem List 1. Abdominal pain, epigastric   2. Cyst of pancreas   3. Indigestion   4. Abnormal findings on diagnostic imaging of other parts of digestive tract     History of Present Illness: This is the patient's first visit to the outpatient GI [place] clinic.  Patient states that last Tuesday she began to experience pain in her mid abdomen in the evening time.  This progressed over the course of the evening into the morning.  Because of discomfort which was described as sharp and stabbing at times progressed she tried to go see her primary care doctor and was told to visit the emergency department because of how significant her pain was.  Earlier in the year she had experienced a somewhat similar type of discomfort while she had been fasting for about a week however this discomfort was treated with 14 days of some sort of antacid (likely a PPI) and improved.  This time around she had been fasting for approximately 24 hours.  When she went to the ED visit she was evaluated and had laboratories taken as well as imaging including an ultrasound and a CT scan which are outlined below.  Her CT scan showed a pancreatic lesion in her tail.  Her lipase was normal.  She had arrived to the hospital within 24 hours of her abdominal pain.  After being given medications to help with her pain she was feeling better and was able to have significant subsiding of her discomfort.  At this point she has a very minimal amount still taking PRN pain medication but has been doing significantly  better.  She does know that if she eats spicy foods or tomato sauces or onions that she may have increased indigestion and heartburn at times.  Fried foods may also cause this as well.  However this is very infrequent.  She has a bowel movement on a daily basis.  However at the day of her presentation she had some loose stools.  She has not noted any blood in any of her stools when she had normal bowel movements or when she is had the diarrhea.  Her bowel movements are now back to normal.  She does not take nonsteroidals or BC/Goody powders on a regular basis.  She has never had an upper or lower endoscopy.  She has no family history of GI malignancies.    GI Review of Systems Positive as above including minimal bloating Negative for dysphagia, odynophagia, nausea, vomiting, melena, hematochezia  Review of Systems General: Denies fevers/chills/weight loss HEENT: Denies oral lesions Cardiovascular: Denies chest pain Pulmonary: Denies shortness of breath Gastroenterological: See HPI Genitourinary: Denies darkened urine Hematological: Denies easy bruising/bleeding Endocrine: Denies temperature intolerance Dermatological: Denies jaundice Psychological: Mood is anxious about the next steps in evaluation   Medications Current Outpatient Medications  Medication Sig Dispense Refill  . omeprazole (PRILOSEC) 40 MG capsule Take 1 capsule (40 mg total) by mouth daily. 30 capsule 2   No current facility-administered medications for this visit.     Allergies No Known Allergies  Histories Past Medical History:  Diagnosis  Date  . Anxiety   . Asthma   . GERD (gastroesophageal reflux disease)   . HLD (hyperlipidemia)   . Hypertension   . Obesity    History reviewed. No pertinent surgical history. Social History   Socioeconomic History  . Marital status: Single    Spouse name: Not on file  . Number of children: 0  . Years of education: Not on file  . Highest education level: Not on file    Occupational History  . Not on file  Social Needs  . Financial resource strain: Not on file  . Food insecurity:    Worry: Not on file    Inability: Not on file  . Transportation needs:    Medical: Not on file    Non-medical: Not on file  Tobacco Use  . Smoking status: Former Smoker    Last attempt to quit: 02/25/2008    Years since quitting: 10.0  . Smokeless tobacco: Never Used  Substance and Sexual Activity  . Alcohol use: Not Currently  . Drug use: No  . Sexual activity: Not Currently  Lifestyle  . Physical activity:    Days per week: Not on file    Minutes per session: Not on file  . Stress: Not on file  Relationships  . Social connections:    Talks on phone: Not on file    Gets together: Not on file    Attends religious service: Not on file    Active member of club or organization: Not on file    Attends meetings of clubs or organizations: Not on file    Relationship status: Not on file  . Intimate partner violence:    Fear of current or ex partner: Not on file    Emotionally abused: Not on file    Physically abused: Not on file    Forced sexual activity: Not on file  Other Topics Concern  . Not on file  Social History Narrative  . Not on file   Family History  Problem Relation Age of Onset  . Heart disease Mother   . Hypertension Mother   . Diabetes Father   . Hypertension Father    I have reviewed her medical, social, and family history in detail and updated the electronic medical record as necessary.    PHYSICAL EXAMINATION  BP 120/74 (BP Location: Left Arm, Patient Position: Sitting, Cuff Size: Normal)   Pulse 88   Ht 5\' 7"  (1.702 m) Comment: height measured without shoes  Wt (!) 345 lb 2 oz (156.5 kg)   LMP 02/09/2018 Comment: neg hcg  BMI 54.05 kg/m  Wt Readings from Last 3 Encounters:  03/01/18 (!) 345 lb 2 oz (156.5 kg)  02/24/18 (!) 340 lb (154.2 kg)  03/28/17 (!) 325 lb (147.4 kg)  GEN: NAD, appears stated age, doesn't appear chronically  ill PSYCH: Cooperative, without pressured speech EYE: Conjunctivae pink, sclerae anicteric ENT: MMM, without oral ulcers, no erythema or exudates noted NECK: Supple with enlarged neck girth CV: RR without R/Gs  RESP: CTAB posteriorly, without wheezing GI: NABS, soft, protuberant, distended, rounded, NT upon light and deep palpation, without rebound or guarding, hepatosplenomegaly difficult to appreciate due to body habitus MSK/EXT: No lower extremity edema SKIN: No jaundice, no spider angiomata NEURO:  Alert & Oriented x 3, no focal deficits   REVIEW OF DATA  I reviewed the following data at the time of this encounter:  GI Procedures and Studies  No relevant studies  Laboratory Studies  Reviewed  in epic We will try and get PCP records from earlier this year to see if she had any evidence of lipase elevation  Imaging Studies  11/19 RUQUS IMPRESSION: 1. No acute RIGHT upper quadrant process. 2. Mild hepatic steatosis.  11/19 CTAP IMPRESSION: 1. 2.8 x 2.9 cm inflamed cyst in tail of pancreas seen with pancreatitis and pseudocyst, or pancreatic cystic neoplasm. Recommend non emergent MRI of the pancreas with contrast for further evaluation. Surgical consultation may be indicated.   ASSESSMENT  Ms. Paternostro is a 27 y.o. female with a pmh significant for Morbid Obesity, HTN, Asthma, Anxiety.  The patient is seen today for evaluation and management of:  1. Abdominal pain, epigastric   2. Cyst of pancreas   3. Indigestion   4. Abnormal findings on diagnostic imaging of other parts of digestive tract    This is a clinically and hemodynamically stable patient this is a hemodynamically stable patient whom we are asked to evaluate in the setting of recent abdominal pain and abnormal CT imaging suggesting a cyst of the pancreas.  At this point in time is not clear to me the etiology of her pancreatic cyst.  She had a normal lipase at the time of her emergency department visit and the  actual cyst itself looks to be more well formed and something that would have been acute in nature where we normally see acute peripancreatic fluid collections that are not as well immature around it.  I wonder if her prior episode of discomfort months ago could have been a bit of pancreatitis however the improvement with just PPI therapy makes me feel that this is less likely.  Her ultrasound imaging did not show any evidence of any significant gallbladder pathology including any cholelithiasis and just showed some mild hepatic steatosis.  Her liver tests have been normal.  At this junction I do think that we need to further evaluate this particular cyst the differential for this remains possibly a IPMN versus a serous cystadenoma versus a pseudocyst.  I think imaging wise this does not have the typical features of an SCA or an IPMN and thus thinking about the role of an EUS with FNA or FNB something we may need to consider.  She has no other red flag symptoms and has not had any significant weight loss.  At this point what I would recommend is we will get laboratories and based on those laboratories if her CA-19-9 is elevated and we will proceed with an earlier EUS with FNA.  If this is negative and she continues to have significant improvement and also giving her PPI therapy improves her pain and I would plan a follow-up image in the course of approximately 6 to 8 weeks.  I would like to have an MRI/MRCP.  If the cyst has remained stable in size or increase in size and I think an EUS with FNA is reasonable.  If the cyst has significantly decreased in size and we will hold on an EUS with FNA.  We would then undergo a more significant diagnostic work-up for potential pancreatitis or etiology of pancreatitis.  We may still need to do an EUS with FNA to ensure that we are not seeing anything change even if the cysts changes in size to be much smaller however I think this much less likely.  The risks of EUS including  bleeding, infection, aspiration pneumonia and intestinal perforation were discussed as was the possibility it may not give a definitive diagnosis.  If a biopsy of the pancreas is done as part of the EUS, there is an additional risk of pancreatitis at the rate of about 1%.  It was explained that procedure related pancreatitis is typically mild, although can be severe and even life threatening, which is why we do not perform random pancreatic biopsies and only biopsy a lesion we feel is concerning enough to warrant the risk.  All patient questions were answered, to the best of my ability, and the patient agrees to the aforementioned plan of action with follow-up as indicated.   PLAN  1. Cyst of pancreas - Cancer Antigen 19-9; Future - MR Abdomen W Wo Contrast; Future - Will determine based on follow up imaging need for EUS with FNA  2. Abdominal pain, epigastric - CBC with Differential/Platelet; Future - Basic Metabolic Panel (BMET); Future - Amylase; Future - Lipase; Future - Triglycerides; Future - Calcium, ionized; Future - IgA; Future - Tissue transglutaminase, IgA; Future - Helicobacter pylori special antigen; Future  3. Indigestion - PPI 40 mg QD and may increase to BID  4. Abnormal findings on diagnostic imaging of other parts of digestive tract   Orders Placed This Encounter  Procedures  . Helicobacter pylori special antigen  . MR Abdomen W Wo Contrast  . CBC with Differential/Platelet  . Basic Metabolic Panel (BMET)  . Amylase  . Lipase  . Triglycerides  . Calcium, ionized  . IgA  . Tissue transglutaminase, IgA  . Cancer Antigen 19-9    New Prescriptions   OMEPRAZOLE (PRILOSEC) 40 MG CAPSULE    Take 1 capsule (40 mg total) by mouth daily.   Modified Medications   No medications on file    Planned Follow Up: No follow-ups on file.   Corliss Parish, MD [facility] Office # [phone]

## 2018-03-02 LAB — TISSUE TRANSGLUTAMINASE, IGA: (tTG) Ab, IgA: 1 U/mL

## 2018-03-02 LAB — CALCIUM, IONIZED: CALCIUM ION: 5.17 mg/dL (ref 4.8–5.6)

## 2018-03-02 LAB — CANCER ANTIGEN 19-9

## 2018-03-03 ENCOUNTER — Other Ambulatory Visit: Payer: BLUE CROSS/BLUE SHIELD

## 2018-03-03 DIAGNOSIS — R1013 Epigastric pain: Secondary | ICD-10-CM

## 2018-03-03 DIAGNOSIS — R933 Abnormal findings on diagnostic imaging of other parts of digestive tract: Secondary | ICD-10-CM

## 2018-03-03 DIAGNOSIS — K862 Cyst of pancreas: Secondary | ICD-10-CM

## 2018-03-04 DIAGNOSIS — K30 Functional dyspepsia: Secondary | ICD-10-CM | POA: Insufficient documentation

## 2018-03-04 DIAGNOSIS — K862 Cyst of pancreas: Secondary | ICD-10-CM

## 2018-03-04 DIAGNOSIS — R933 Abnormal findings on diagnostic imaging of other parts of digestive tract: Secondary | ICD-10-CM

## 2018-03-04 DIAGNOSIS — R1013 Epigastric pain: Secondary | ICD-10-CM | POA: Insufficient documentation

## 2018-03-04 HISTORY — DX: Cyst of pancreas: K86.2

## 2018-03-04 HISTORY — DX: Abnormal findings on diagnostic imaging of other parts of digestive tract: R93.3

## 2018-03-04 HISTORY — DX: Epigastric pain: R10.13

## 2018-03-04 LAB — HELICOBACTER PYLORI  SPECIAL ANTIGEN
MICRO NUMBER: 91367381
SPECIMEN QUALITY: ADEQUATE

## 2018-03-17 ENCOUNTER — Inpatient Hospital Stay: Admission: RE | Admit: 2018-03-17 | Payer: BLUE CROSS/BLUE SHIELD | Source: Ambulatory Visit

## 2018-04-01 ENCOUNTER — Other Ambulatory Visit: Payer: BLUE CROSS/BLUE SHIELD

## 2018-04-02 ENCOUNTER — Ambulatory Visit: Payer: BLUE CROSS/BLUE SHIELD | Admitting: Gastroenterology

## 2018-05-10 ENCOUNTER — Ambulatory Visit: Payer: BLUE CROSS/BLUE SHIELD | Admitting: Gastroenterology

## 2019-09-15 IMAGING — CT CT ABD-PELV W/ CM
1 of 2 series · 14 of 32 positions shown, 19 images · IV contrast (ISOVUE)
Comparison: RIGHT upper quadrant ultrasound February 24, 2018

CLINICAL DATA: Generalized abdominal pain, nausea and vomiting
beginning this morning.

EXAM:
CT ABDOMEN AND PELVIS WITH CONTRAST
TECHNIQUE: Multidetector CT imaging of the abdomen and pelvis was performed
using the standard protocol following bolus administration of
intravenous contrast.
CONTRAST:  100mL [id number] IOPAMIDOL (S8VF2I-S55) INJECTION 61%

[Series 2: axial st · axial · 0.88mm/px · z∈[+1095,+1530]mm · 14 of 99 slices shown, 19 images]
[im 6/99  soft-tissue]
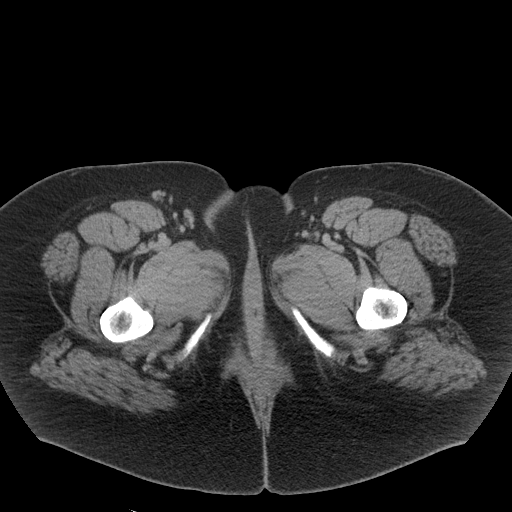
[im 6/99  bone]
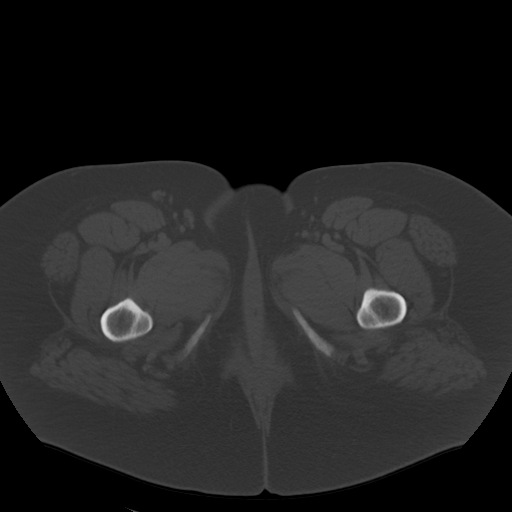
[im 16/99  soft-tissue]
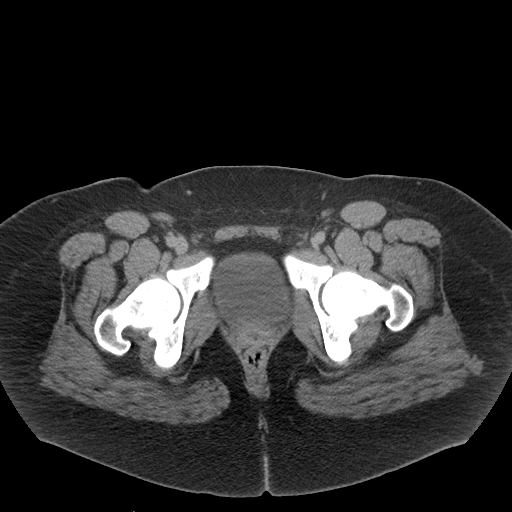
[im 21/99  soft-tissue]
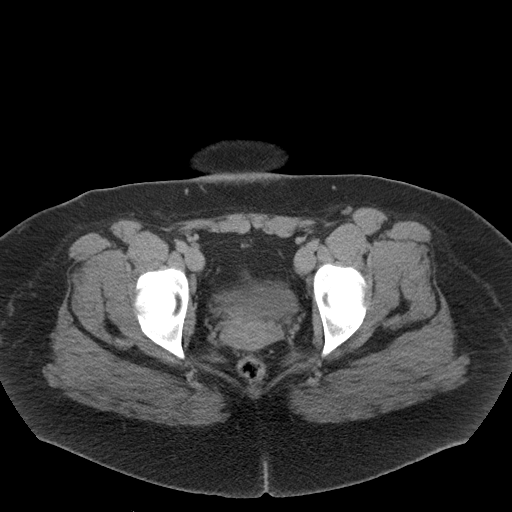
[im 26/99  soft-tissue]
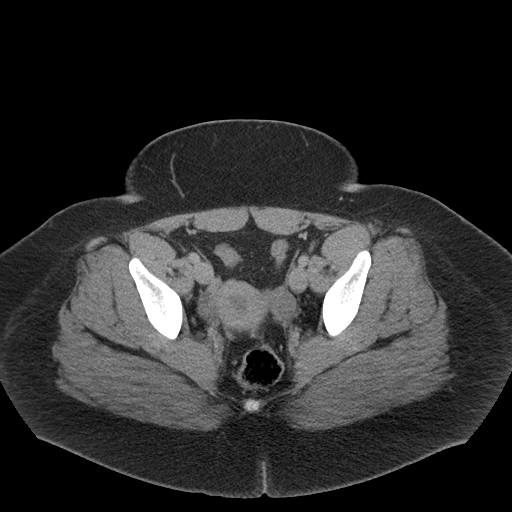
[im 37/99  soft-tissue]
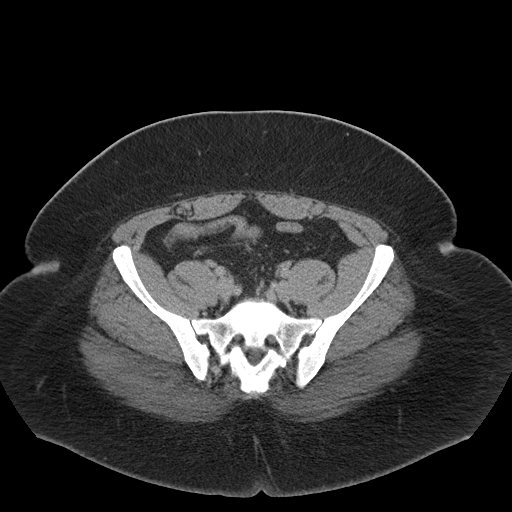
[im 42/99  soft-tissue]
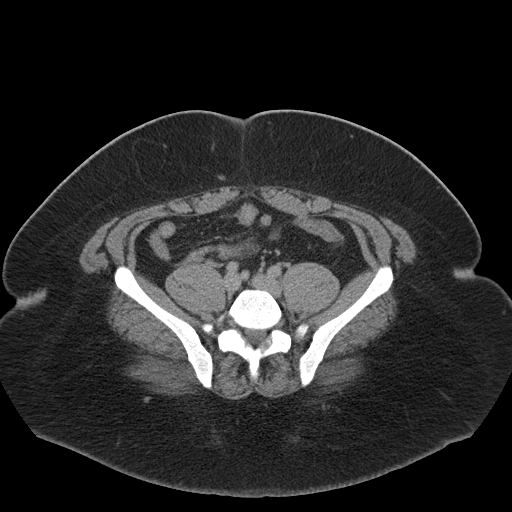
[im 52/99  soft-tissue]
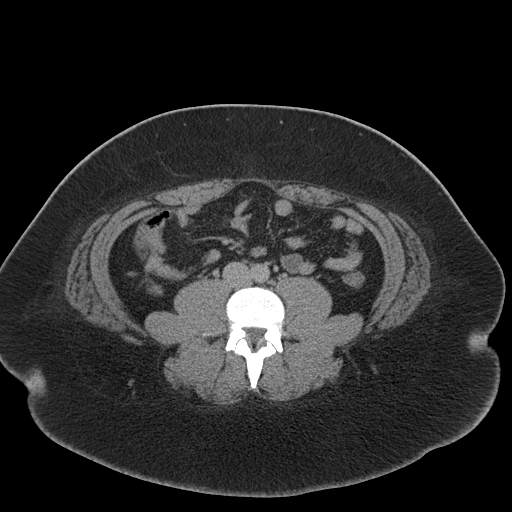
[im 57/99  soft-tissue]
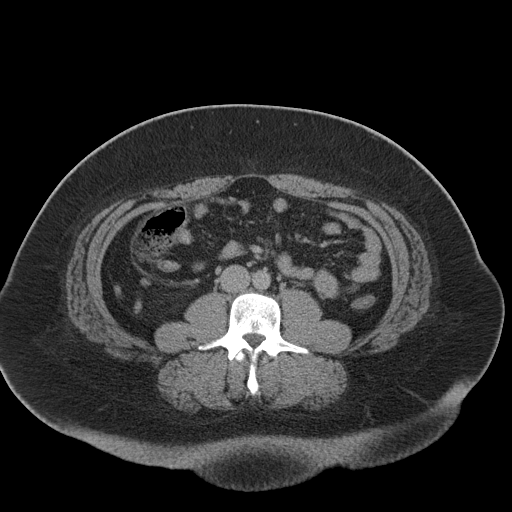
[im 62/99  soft-tissue]
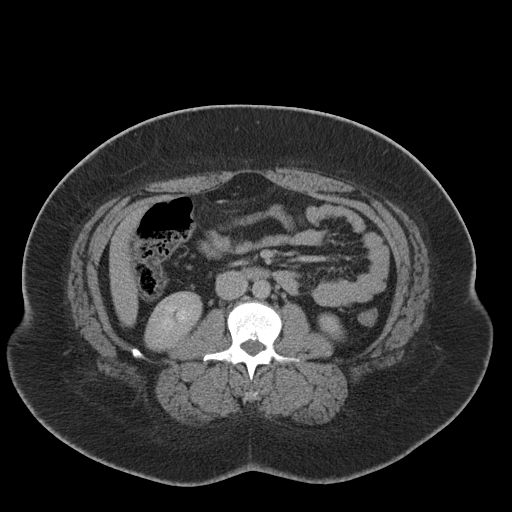
[im 62/99  bone]
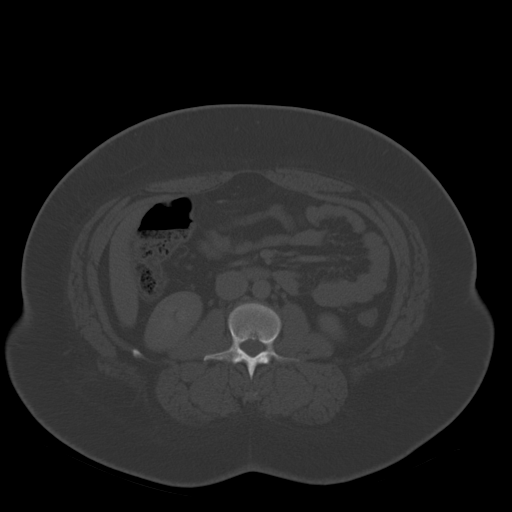
[im 73/99  soft-tissue]
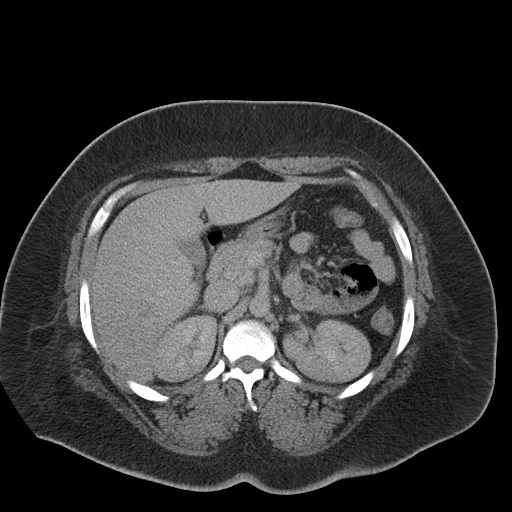
[im 78/99  soft-tissue]
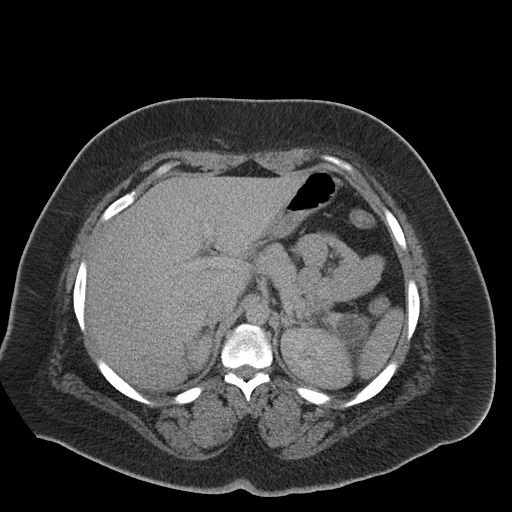
[im 78/99  lung]
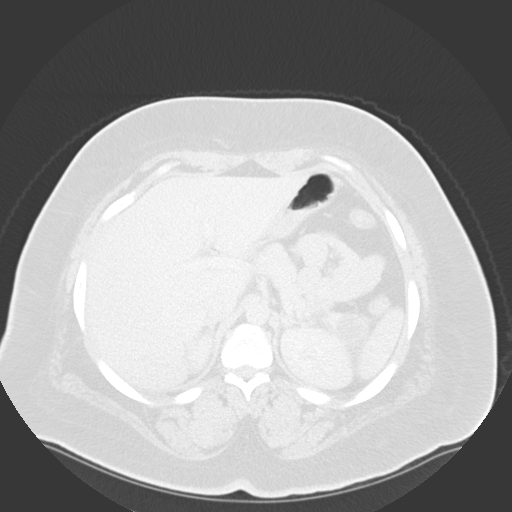
[im 83/99  soft-tissue]
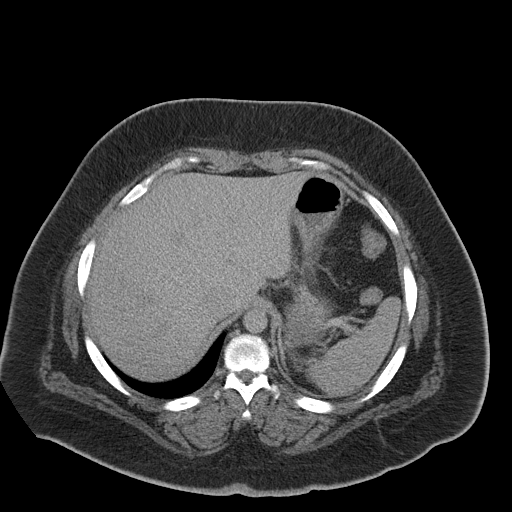
[im 83/99  lung]
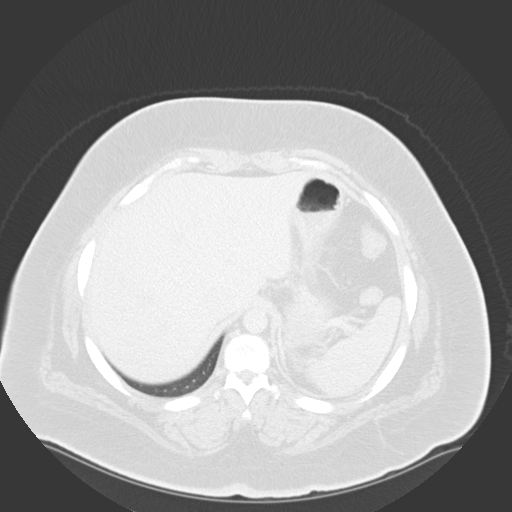
[im 88/99  lung]
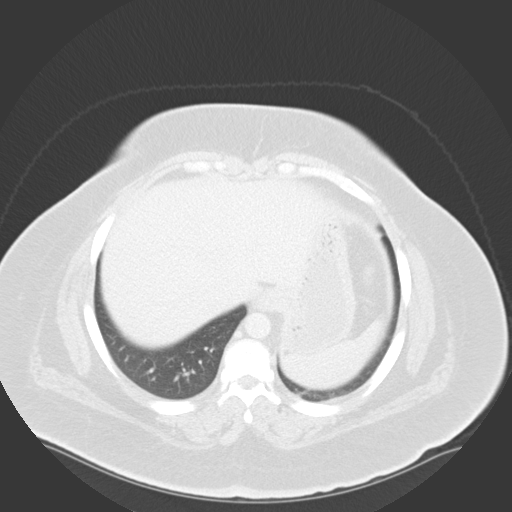
[im 93/99  soft-tissue]
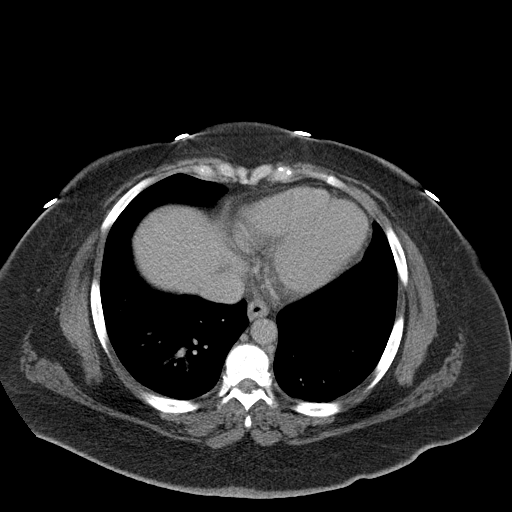
[im 93/99  lung]
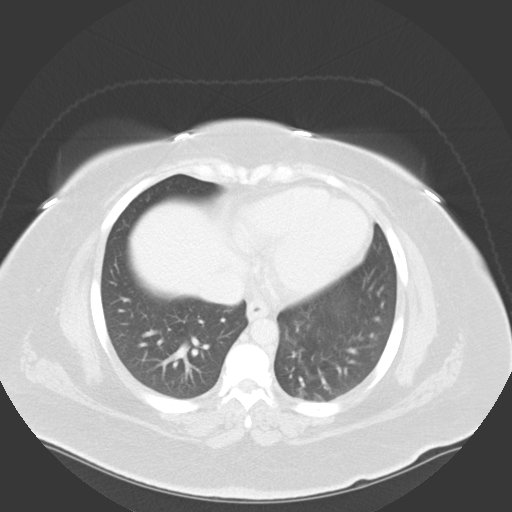

[14 of 32 positions shown; findings below may reference images not displayed]

FINDINGS: LOWER CHEST: Lung bases are clear. Included heart size is normal. No
pericardial effusion.

HEPATOBILIARY: Liver and gallbladder are normal.

PANCREAS: 2.8 x 2.9 cm cyst (-4 Hounsfield units) within tail of the
pancreas with mild surrounding inflammatory changes, no discrete
capsular enhancement. No calcifications or ductal dilatation.

SPLEEN: Normal.

ADRENALS/URINARY TRACT: Kidneys are orthotopic, demonstrating
symmetric enhancement. No nephrolithiasis, hydronephrosis or solid
renal masses. The unopacified ureters are normal in course and
caliber. Urinary bladder is partially distended and unremarkable.
Normal adrenal glands.

STOMACH/BOWEL: The stomach, small and large bowel are normal in
course and caliber without inflammatory changes. Normal appendix.

VASCULAR/LYMPHATIC: Aortoiliac vessels are normal in course and
caliber. No lymphadenopathy by CT size criteria.

REPRODUCTIVE: Normal.

OTHER: No intraperitoneal free fluid or free air.

MUSCULOSKELETAL: Nonacute.
IMPRESSION: 1. 2.8 x 2.9 cm inflamed cyst in tail of pancreas seen with
pancreatitis and pseudocyst, or pancreatic cystic neoplasm.
Recommend non emergent MRI of the pancreas with contrast for further
evaluation. Surgical consultation may be indicated.
2. Acute findings discussed with and reconfirmed by PA NOMASIBULELE on
02/24/2018 at [DATE].

## 2019-09-25 ENCOUNTER — Other Ambulatory Visit: Payer: Self-pay

## 2019-09-25 ENCOUNTER — Emergency Department (HOSPITAL_BASED_OUTPATIENT_CLINIC_OR_DEPARTMENT_OTHER): Payer: Managed Care, Other (non HMO)

## 2019-09-25 ENCOUNTER — Emergency Department (HOSPITAL_COMMUNITY)
Admission: EM | Admit: 2019-09-25 | Discharge: 2019-09-25 | Disposition: A | Discharge status: Home / Self Care | Payer: Managed Care, Other (non HMO) | Attending: Emergency Medicine | Admitting: Emergency Medicine

## 2019-09-25 ENCOUNTER — Encounter (HOSPITAL_COMMUNITY): Payer: Self-pay

## 2019-09-25 DIAGNOSIS — Z87891 Personal history of nicotine dependence: Secondary | ICD-10-CM | POA: Diagnosis not present

## 2019-09-25 DIAGNOSIS — R2242 Localized swelling, mass and lump, left lower limb: Secondary | ICD-10-CM | POA: Insufficient documentation

## 2019-09-25 DIAGNOSIS — M7989 Other specified soft tissue disorders: Secondary | ICD-10-CM | POA: Diagnosis not present

## 2019-09-25 DIAGNOSIS — M79662 Pain in left lower leg: Secondary | ICD-10-CM | POA: Insufficient documentation

## 2019-09-25 DIAGNOSIS — I10 Essential (primary) hypertension: Secondary | ICD-10-CM | POA: Insufficient documentation

## 2019-09-25 DIAGNOSIS — M79606 Pain in leg, unspecified: Secondary | ICD-10-CM

## 2019-09-25 DIAGNOSIS — M79605 Pain in left leg: Secondary | ICD-10-CM | POA: Diagnosis present

## 2019-09-25 NOTE — ED Notes (Signed)
Vascular at bedside

## 2019-09-25 NOTE — Discharge Instructions (Signed)
Recommend Tylenol Motrin as needed for pain control.  Recommend elevating leg at night.  Recommend follow-up with primary doctor regarding the symptoms.  If you have worsening pain, swelling, fevers, redness, return to ER for reassessment.

## 2019-09-25 NOTE — ED Provider Notes (Signed)
[place] [facility] DEPT Provider Note   CSN: [id number] Arrival date & time: 09/25/19  1057     History Chief Complaint  Patient presents with  . Leg Swelling  . Leg Pain    Tammie Thomas is a 29 y.o. female.  Presents here with concern for leg pain.  Patient states for the past couple months she has been having intermittent left leg pain, states also has noted some mild swelling in her leg.  Denies prior history of DVT/PE.  Has not noted any rash, skin discoloration.  Pain currently is quite mild, seems to come and go at random.  HPI     Past Medical History:  Diagnosis Date  . Anxiety   . Asthma   . GERD (gastroesophageal reflux disease)   . HLD (hyperlipidemia)   . Hypertension   . Obesity     Patient Active Problem List   Diagnosis Date Noted  . Indigestion 03/04/2018  . Abdominal pain, epigastric 03/04/2018  . Cyst of pancreas 03/04/2018  . Abnormal findings on diagnostic imaging of other parts of digestive tract 03/04/2018    History reviewed. No pertinent surgical history.   OB History   No obstetric history on file.     Family History  Problem Relation Age of Onset  . Heart disease Mother   . Hypertension Mother   . Diabetes Father   . Hypertension Father     Social History   Tobacco Use  . Smoking status: Former Smoker    Quit date: 02/25/2008    Years since quitting: 11.5  . Smokeless tobacco: Never Used  Substance Use Topics  . Alcohol use: Not Currently  . Drug use: No    Home Medications Prior to Admission medications   Medication Sig Start Date End Date Taking? Authorizing Provider  omeprazole (PRILOSEC) 40 MG capsule Take 1 capsule (40 mg total) by mouth daily. 03/01/18   Mansouraty, Telford Nab., MD    Allergies    Patient has no known allergies.  Review of Systems   Review of Systems  Constitutional: Negative for chills and fever.  HENT: Negative for ear pain and sore throat.   Eyes: Negative for pain  and visual disturbance.  Respiratory: Negative for cough and shortness of breath.   Cardiovascular: Negative for chest pain and palpitations.  Gastrointestinal: Negative for abdominal pain and vomiting.  Genitourinary: Negative for dysuria and hematuria.  Musculoskeletal: Positive for arthralgias. Negative for back pain.  Skin: Negative for color change and rash.  Neurological: Negative for seizures and syncope.  All other systems reviewed and are negative.   Physical Exam Updated Vital Signs BP 117/88 (BP Location: Left Arm)   Pulse 98   Temp 98.2 F (36.8 C) (Oral)   Resp 16   SpO2 100%   Physical Exam Vitals and nursing note reviewed.  Constitutional:      General: She is not in acute distress.    Appearance: She is well-developed.  HENT:     Head: Normocephalic and atraumatic.  Eyes:     Conjunctiva/sclera: Conjunctivae normal.  Cardiovascular:     Rate and Rhythm: Normal rate and regular rhythm.     Heart sounds: No murmur.  Pulmonary:     Effort: Pulmonary effort is normal. No respiratory distress.     Breath sounds: Normal breath sounds.  Abdominal:     Palpations: Abdomen is soft.     Tenderness: There is no abdominal tenderness.  Musculoskeletal:     Cervical  back: Neck supple.     Comments: No appreciable swelling in bilateral lower extremities, there is no tenderness to palpation throughout bilateral lower extremities, normal DP and PT pulses bilaterally  Skin:    General: Skin is warm and dry.  Neurological:     General: No focal deficit present.     Mental Status: She is alert and oriented to person, place, and time.     ED Results / Procedures / Treatments   Labs (all labs ordered are listed, but only abnormal results are displayed) Labs Reviewed - No data to display  EKG None  Radiology VAS [place] LOWER EXTREMITY VENOUS (DVT) ([facility] and [facility] 7a-7p)  Result Date: 09/25/2019  Lower Venous DVTStudy Indications: Patient presents with swelling for several  months in her left leg and an onset of pain today.  Risk Factors: Obesity. Limitations: Body habitus. Performing Technologist: Marilynne Halsted RDMS, RVT  Examination Guidelines: A complete evaluation includes B-mode imaging, spectral Doppler, color Doppler, and power Doppler as needed of all accessible portions of each vessel. Bilateral testing is considered an integral part of a complete examination. Limited examinations for reoccurring indications may be performed as noted. The reflux portion of the exam is performed with the patient in reverse Trendelenburg.  +-----+---------------+---------+-----------+----------+--------------+ RIGHTCompressibilityPhasicitySpontaneityPropertiesThrombus Aging +-----+---------------+---------+-----------+----------+--------------+ CFV  Full           Yes      Yes                                 +-----+---------------+---------+-----------+----------+--------------+ SFJ  Full                                                        +-----+---------------+---------+-----------+----------+--------------+   +---------+---------------+---------+-----------+----------+-----------------+ LEFT     CompressibilityPhasicitySpontaneityPropertiesThrombus Aging    +---------+---------------+---------+-----------+----------+-----------------+ CFV      Full           Yes      Yes                                    +---------+---------------+---------+-----------+----------+-----------------+ SFJ      Full                                                           +---------+---------------+---------+-----------+----------+-----------------+ FV Prox  Full                                                           +---------+---------------+---------+-----------+----------+-----------------+ FV Mid   Full                                                           +---------+---------------+---------+-----------+----------+-----------------+ FV  DistalFull                                                           +---------+---------------+---------+-----------+----------+-----------------+ PFV      Full                                                           +---------+---------------+---------+-----------+----------+-----------------+ POP      Full           Yes      Yes                                    +---------+---------------+---------+-----------+----------+-----------------+ PTV                                                   PATENT WITH COLOR +---------+---------------+---------+-----------+----------+-----------------+ PERO                                                  PATENT WITH COLOR +---------+---------------+---------+-----------+----------+-----------------+     Summary: RIGHT: - No evidence of common femoral vein obstruction.  LEFT: - There is no evidence of deep vein thrombosis in the lower extremity.  - No cystic structure found in the popliteal fossa.  *See table(s) above for measurements and observations.    Preliminary     Procedures Procedures (including critical care time)  Medications Ordered in ED Medications - No data to display  ED Course  I have reviewed the triage vital signs and the nursing notes.  Pertinent labs & imaging results that were available during my care of the patient were reviewed by me and considered in my medical decision making (see chart for details).    MDM Rules/Calculators/A&P                      29 year old lady presented to ER with unilateral left leg pain for the past couple months, no associated injuries, and physical exam, no appreciable deformity or swelling was noted.  DVT study was negative.  Suspect MSK strain.  Ambulatory without issue, pain well controlled, discharged home.  Recommended follow-up with primary doctor.    After the discussed management above, the patient was determined to be safe for discharge.  The patient was in  agreement with this plan and all questions regarding their care were answered.  ED return precautions were discussed and the patient will return to the ED with any significant worsening of condition.    Final Clinical Impression(s) / ED Diagnoses Final diagnoses:  Pain of lower extremity, unspecified laterality    Rx / DC Orders ED Discharge Orders    None       Milagros Loll, MD 09/25/19 1437

## 2019-09-25 NOTE — ED Triage Notes (Signed)
Pt reports noticing a new bruise on her left upper thigh and having pain after working on her feet all day. Pt reports going to UC and they sent her here to rule out a blood clot. Pt reports she has some lower back pain as well and stated that she think it might be sciatica pain.

## 2021-05-30 ENCOUNTER — Ambulatory Visit (HOSPITAL_COMMUNITY): Payer: Self-pay | Admitting: Psychiatry

## 2021-10-23 ENCOUNTER — Encounter: Payer: Self-pay | Admitting: General Practice

## 2021-11-28 ENCOUNTER — Encounter: Payer: Self-pay | Admitting: General Practice

## 2021-11-28 ENCOUNTER — Encounter: Payer: Self-pay | Admitting: Family Medicine

## 2021-11-28 ENCOUNTER — Other Ambulatory Visit (HOSPITAL_COMMUNITY)
Admission: RE | Admit: 2021-11-28 | Discharge: 2021-11-28 | Disposition: A | Discharge status: Home / Self Care | Payer: 59 | Source: Ambulatory Visit | Attending: Family Medicine | Admitting: Family Medicine

## 2021-11-28 ENCOUNTER — Ambulatory Visit (INDEPENDENT_AMBULATORY_CARE_PROVIDER_SITE_OTHER): Payer: 59 | Admitting: Family Medicine

## 2021-11-28 VITALS — BP 99/71 | HR 84 | Wt 353.0 lb

## 2021-11-28 DIAGNOSIS — Z8679 Personal history of other diseases of the circulatory system: Secondary | ICD-10-CM

## 2021-11-28 DIAGNOSIS — O0990 Supervision of high risk pregnancy, unspecified, unspecified trimester: Secondary | ICD-10-CM | POA: Insufficient documentation

## 2021-11-28 DIAGNOSIS — Z6841 Body Mass Index (BMI) 40.0 and over, adult: Secondary | ICD-10-CM

## 2021-11-28 DIAGNOSIS — Z3A11 11 weeks gestation of pregnancy: Secondary | ICD-10-CM

## 2021-11-28 DIAGNOSIS — O0991 Supervision of high risk pregnancy, unspecified, first trimester: Secondary | ICD-10-CM

## 2021-11-28 MED ORDER — DOXYLAMINE-PYRIDOXINE 10-10 MG PO TBEC: 2.0000 | DELAYED_RELEASE_TABLET | Freq: Every day | ORAL | 5 refills | Status: DC

## 2021-11-28 MED ORDER — ASPIRIN 81 MG PO TBEC: 81.0000 mg | DELAYED_RELEASE_TABLET | Freq: Every day | ORAL | 2 refills | Status: DC

## 2021-11-28 NOTE — Progress Notes (Signed)
Subjective:  Tammie Thomas is a G1P0 [redacted]w[redacted]d by LMP and consistent with [place] by me today. She is being seen today for her first obstetrical visit.  Her obstetrical history is significant for obesity and first pregnancy. This was not a planned pregnancy, but she and her partner are excited. Patient does intend to breast feed. Pregnancy history fully reviewed.  Patient reports nausea.  BP 99/71   Pulse 84   Wt (!) 353 lb (160.1 kg)   LMP 09/07/2021   BMI 55.29 kg/m   HISTORY: OB History  Gravida Para Term Preterm AB Living  1            SAB IAB Ectopic Multiple Live Births               # Outcome Date GA Lbr Len/2nd Weight Sex Delivery Anes PTL Lv  1 Current             Past Medical History:  Diagnosis Date   Anxiety    Asthma    GERD (gastroesophageal reflux disease)    HLD (hyperlipidemia)    Hypertension    Obesity     History reviewed. No pertinent surgical history.  Family History  Problem Relation Age of Onset   Heart disease Mother    Hypertension Mother    Diabetes Father    Hypertension Father      Exam  BP 99/71   Pulse 84   Wt (!) 353 lb (160.1 kg)   LMP 09/07/2021   BMI 55.29 kg/m   Chaperone present during exam  CONSTITUTIONAL: Well-developed, well-nourished female in no acute distress.  HENT:  Normocephalic, atraumatic, External right and left ear normal. Oropharynx is clear and moist EYES: Conjunctivae and EOM are normal. Pupils are equal, round, and reactive to light. No scleral icterus.  NECK: Normal range of motion, supple, no masses.  Normal thyroid.  CARDIOVASCULAR: Normal heart rate noted, regular rhythm RESPIRATORY: Clear to auscultation bilaterally. Effort and breath sounds normal, no problems with respiration noted. BREASTS: Symmetric in size. No masses, skin changes, nipple drainage, or lymphadenopathy. ABDOMEN: Soft, normal bowel sounds, no distention noted.  No tenderness, rebound or guarding.  PELVIC: Normal appearing external  genitalia; normal appearing vaginal mucosa and cervix. No abnormal discharge noted. Normal uterine size, no other palpable masses, no uterine or adnexal tenderness. MUSCULOSKELETAL: Normal range of motion. No tenderness.  No cyanosis, clubbing, or edema.  2+ distal pulses. SKIN: Skin is warm and dry. No rash noted. Not diaphoretic. No erythema. No pallor. NEUROLOGIC: Alert and oriented to person, place, and time. Normal reflexes, muscle tone coordination. No cranial nerve deficit noted. PSYCHIATRIC: Normal mood and affect. Normal behavior. Normal judgment and thought content.    Assessment:    Pregnancy: G1P0 Patient Active Problem List   Diagnosis Date Noted   Supervision of high risk pregnancy, antepartum 11/28/2021   Indigestion 03/04/2018   Abdominal pain, epigastric 03/04/2018   Cyst of pancreas 03/04/2018   Abnormal findings on diagnostic imaging of other parts of digestive tract 03/04/2018      Plan:   1. Supervision of high risk pregnancy, antepartum FHT normal. - Cytology - PAP( [place]) - [place] MFM OB DETAIL +14 WK; Future - Culture, OB Urine - [facility] AMB BABYSCRIPTS OPT IN - Protein / creatinine ratio, urine  2. History of hypertension The patient has had hypertension in the past, although most recently her nonpregnant blood pressure has been normal without antihypertensives.  Unclear of whether she should be  classified as chronic hypertensive or not.  Will watch her blood pressures closely and start aspirin. - Protein / creatinine ratio, urine  3. BMI 50.0-59.9, adult (HCC) HgA1c today. Will need early 2hr testing with improvement of nausea.     Initial labs obtained Continue prenatal vitamins Reviewed n/v relief measures and warning s/s to report Reviewed recommended weight gain based on pre-gravid BMI Encouraged well-balanced diet Genetic & carrier screening discussed: requests Panorama and [facility] ,  Ultrasound discussed; fetal survey: requested CCNC  completed> form faxed if has or is planning to apply for medicaid The nature of [facility] [employer] with multiple MDs and other Advanced Practice Providers was explained to patient; also emphasized that fellows, residents, and students are part of our team.   Problem list reviewed and updated. 75% of 30 min visit spent on counseling and coordination of care.     Levie Heritage 11/28/2021

## 2021-11-29 LAB — PROTEIN / CREATININE RATIO, URINE
Creatinine, Urine: 296.7 mg/dL
Protein, Ur: 15.9 mg/dL
Protein/Creat Ratio: 54 mg/g creat (ref 0–200)

## 2021-11-30 LAB — URINE CULTURE, OB REFLEX

## 2021-11-30 LAB — CULTURE, OB URINE

## 2021-12-02 ENCOUNTER — Other Ambulatory Visit: Payer: 59

## 2021-12-03 LAB — CYTOLOGY - PAP
Chlamydia: NEGATIVE
Comment: NEGATIVE
Comment: NEGATIVE
Comment: NORMAL
Diagnosis: NEGATIVE
High risk HPV: NEGATIVE
Neisseria Gonorrhea: NEGATIVE

## 2021-12-09 ENCOUNTER — Other Ambulatory Visit: Payer: 59

## 2022-01-02 ENCOUNTER — Encounter: Payer: Self-pay | Admitting: Obstetrics & Gynecology

## 2022-01-02 ENCOUNTER — Ambulatory Visit (INDEPENDENT_AMBULATORY_CARE_PROVIDER_SITE_OTHER): Payer: Managed Care, Other (non HMO) | Admitting: Obstetrics & Gynecology

## 2022-01-02 VITALS — BP 105/62 | HR 93 | Wt 363.0 lb

## 2022-01-02 DIAGNOSIS — O99210 Obesity complicating pregnancy, unspecified trimester: Secondary | ICD-10-CM

## 2022-01-02 DIAGNOSIS — O0990 Supervision of high risk pregnancy, unspecified, unspecified trimester: Secondary | ICD-10-CM

## 2022-01-02 DIAGNOSIS — Z3A16 16 weeks gestation of pregnancy: Secondary | ICD-10-CM

## 2022-01-02 DIAGNOSIS — Z8679 Personal history of other diseases of the circulatory system: Secondary | ICD-10-CM

## 2022-01-02 NOTE — Progress Notes (Signed)
af

## 2022-01-02 NOTE — Progress Notes (Signed)
   PRENATAL VISIT NOTE  Subjective:  Tammie Thomas is a 31 y.o. G1P0 at [redacted]w[redacted]d being seen today for ongoing prenatal care.  She is currently monitored for the following issues for this high-risk pregnancy and has Indigestion; Cyst of pancreas; Supervision of high risk pregnancy, antepartum; BMI 50.0-59.9, adult (HCC); History of hypertension; and Maternal morbid obesity, antepartum (HCC) on their problem list.  Patient reports no complaints.  Contractions: Not present. Vag. Bleeding: None.  Movement: Absent. Denies leaking of fluid.   The following portions of the patient's history were reviewed and updated as appropriate: allergies, current medications, past family history, past medical history, past social history, past surgical history and problem list.   Objective:   Vitals:   01/02/22 1540  BP: 105/62  Pulse: 93  Weight: (!) 363 lb (164.7 kg)    Fetal Status: Fetal Heart Rate (bpm): 172   Movement: Absent     General:  Alert, oriented and cooperative. Patient is in no acute distress.  Skin: Skin is warm and dry. No rash noted.   Cardiovascular: Normal heart rate noted  Respiratory: Normal respiratory effort, no problems with respiration noted  Abdomen: Soft, gravid, appropriate for gestational age.  Pain/Pressure: Present     Pelvic: Cervical exam deferred        Extremities: Normal range of motion.  Edema: Trace  Mental Status: Normal mood and affect. Normal behavior. Normal judgment and thought content.   Assessment and Plan:  Pregnancy: G1P0 at [redacted]w[redacted]d 1. History of hypertension Stable BP, continue ASA.  2. Maternal morbid obesity, antepartum (HCC) Will check labs today. TWG 24 lbs, more than recommended. - Hemoglobin A1c - Comprehensive metabolic panel - TSH Rfx on Abnormal to Free T4  3. [redacted] weeks gestation of pregnancy 4. Supervision of high risk pregnancy, antepartum Other labs to be checked today, will follow up results and manage accordingly. -  CBC/D/Plt+RPR+Rh+ABO+RubIgG... - Panorama Prenatal Test Full Panel - [facility] CUSTOM - AFP, Serum, Open Spina Bifida No other complaints or concerns.  Routine obstetric precautions reviewed.  Please refer to After Visit Summary for other counseling recommendations.   Return in about 4 weeks (around 01/30/2022) for OFFICE OB VISIT (MD only).  Future Appointments  Date Time Provider Department Center  01/22/2022  2:30 PM [facility] NURSE [facility]  01/22/2022  2:45 PM [facility] US4 [facility]  01/30/2022  3:10 PM [place], Rhona Raider, DO [facility] None    Jaynie Collins, MD

## 2022-01-06 ENCOUNTER — Ambulatory Visit: Payer: Managed Care, Other (non HMO)

## 2022-01-10 ENCOUNTER — Ambulatory Visit: Payer: Managed Care, Other (non HMO)

## 2022-01-22 ENCOUNTER — Encounter: Payer: Self-pay | Admitting: *Deleted

## 2022-01-22 ENCOUNTER — Ambulatory Visit: Payer: Commercial Managed Care - HMO | Attending: Family Medicine

## 2022-01-22 ENCOUNTER — Ambulatory Visit: Payer: Commercial Managed Care - HMO | Admitting: *Deleted

## 2022-01-22 VITALS — BP 156/77 | HR 92

## 2022-01-22 DIAGNOSIS — O0990 Supervision of high risk pregnancy, unspecified, unspecified trimester: Secondary | ICD-10-CM | POA: Diagnosis present

## 2022-01-22 DIAGNOSIS — Z3A19 19 weeks gestation of pregnancy: Secondary | ICD-10-CM

## 2022-01-22 DIAGNOSIS — E669 Obesity, unspecified: Secondary | ICD-10-CM

## 2022-01-22 DIAGNOSIS — O99212 Obesity complicating pregnancy, second trimester: Secondary | ICD-10-CM

## 2022-01-22 DIAGNOSIS — O35BXX0 Maternal care for other (suspected) fetal abnormality and damage, fetal cardiac anomalies, not applicable or unspecified: Secondary | ICD-10-CM

## 2022-01-23 ENCOUNTER — Other Ambulatory Visit: Payer: Self-pay | Admitting: *Deleted

## 2022-01-23 DIAGNOSIS — O283 Abnormal ultrasonic finding on antenatal screening of mother: Secondary | ICD-10-CM

## 2022-01-23 DIAGNOSIS — Z362 Encounter for other antenatal screening follow-up: Secondary | ICD-10-CM

## 2022-01-23 DIAGNOSIS — Z8679 Personal history of other diseases of the circulatory system: Secondary | ICD-10-CM

## 2022-01-23 DIAGNOSIS — O99212 Obesity complicating pregnancy, second trimester: Secondary | ICD-10-CM

## 2022-01-30 ENCOUNTER — Ambulatory Visit (INDEPENDENT_AMBULATORY_CARE_PROVIDER_SITE_OTHER): Payer: Commercial Managed Care - HMO | Admitting: Family Medicine

## 2022-01-30 VITALS — BP 146/87 | HR 104 | Wt 366.0 lb

## 2022-01-30 DIAGNOSIS — O0992 Supervision of high risk pregnancy, unspecified, second trimester: Secondary | ICD-10-CM

## 2022-01-30 DIAGNOSIS — O99212 Obesity complicating pregnancy, second trimester: Secondary | ICD-10-CM

## 2022-01-30 DIAGNOSIS — O99210 Obesity complicating pregnancy, unspecified trimester: Secondary | ICD-10-CM

## 2022-01-30 DIAGNOSIS — Z8679 Personal history of other diseases of the circulatory system: Secondary | ICD-10-CM

## 2022-01-30 DIAGNOSIS — F419 Anxiety disorder, unspecified: Secondary | ICD-10-CM

## 2022-01-30 DIAGNOSIS — Z6841 Body Mass Index (BMI) 40.0 and over, adult: Secondary | ICD-10-CM

## 2022-01-30 DIAGNOSIS — Z3A20 20 weeks gestation of pregnancy: Secondary | ICD-10-CM

## 2022-01-30 DIAGNOSIS — O0990 Supervision of high risk pregnancy, unspecified, unspecified trimester: Secondary | ICD-10-CM

## 2022-01-30 DIAGNOSIS — E119 Type 2 diabetes mellitus without complications: Secondary | ICD-10-CM

## 2022-01-30 MED ORDER — CITALOPRAM HYDROBROMIDE 10 MG PO TABS: 10.0000 mg | ORAL_TABLET | Freq: Every day | ORAL | 3 refills | Status: DC

## 2022-01-30 NOTE — Progress Notes (Signed)
Patient states that she thinks the baby asa makes her swell. Patient questions if she could take something for anxiety during pregnancy. Kathrene Alu RN

## 2022-01-30 NOTE — Progress Notes (Signed)
   PRENATAL VISIT NOTE  Subjective:  Tammie Thomas is a 31 y.o. G1P0 at [redacted]w[redacted]d being seen today for ongoing prenatal care.  She is currently monitored for the following issues for this high-risk pregnancy and has Indigestion; Cyst of pancreas; Supervision of high risk pregnancy, antepartum; BMI 50.0-59.9, adult ([place]); History of hypertension; Maternal morbid obesity, antepartum ([place]); and Anxiety on their problem list.  Patient reports  having increased anxiety. Was on medication before. Doesn't recall what it was.  BP has been a little elevated.   Contractions: Not present. Vag. Bleeding: None.  Movement: Present. Denies leaking of fluid.   The following portions of the patient's history were reviewed and updated as appropriate: allergies, current medications, past family history, past medical history, past social history, past surgical history and problem list.   Objective:   Vitals:   01/30/22 1526  BP: (!) 146/87  Pulse: (!) 104  Weight: (!) 366 lb (166 kg)    Fetal Status: Fetal Heart Rate (bpm): 158   Movement: Present     General:  Alert, oriented and cooperative. Patient is in no acute distress.  Skin: Skin is warm and dry. No rash noted.   Cardiovascular: Normal heart rate noted  Respiratory: Normal respiratory effort, no problems with respiration noted  Abdomen: Soft, gravid, appropriate for gestational age.  Pain/Pressure: Present     Pelvic: Cervical exam deferred        Extremities: Normal range of motion.  Edema: Trace  Mental Status: Normal mood and affect. Normal behavior. Normal judgment and thought content.   Assessment and Plan:  Pregnancy: G1P0 at [redacted]w[redacted]d 1. Supervision of high risk pregnancy, antepartum FHT and FH normal - CBC/D/Plt+RPR+Rh+ABO+RubIgG... - Hemoglobin A1c - Comprehensive metabolic panel - AFP, Serum, Open Spina Bifida  2. BMI 50.0-59.9, adult ([place])  3. Maternal morbid obesity, antepartum ([place])  4. History of hypertension BP elevated today.  Will recheck in 2 weeks - if still elevated start medications Growth [place] in 4 weeks.  5. Anxiety Start celexa.  - Ambulatory referral to [place]  Preterm labor symptoms and general obstetric precautions including but not limited to vaginal bleeding, contractions, leaking of fluid and fetal movement were reviewed in detail with the patient. Please refer to After Visit Summary for other counseling recommendations.   Return in about 2 weeks (around 02/13/2022) for BP check.  Future Appointments  Date Time Provider [place]  02/13/2022  4:00 PM [facility] NURSE [facility] None  02/27/2022 11:15 AM [facility] NURSE [facility]  02/27/2022 11:30 AM [facility] US3 [facility]  02/27/2022  4:10 PM Nehemiah Settle, Tanna Savoy, DO [facility] None    Truett Mainland, DO

## 2022-02-04 LAB — CBC/D/PLT+RPR+RH+ABO+RUBIGG...
Antibody Screen: NEGATIVE
Basophils Absolute: 0 x10E3/uL (ref 0.0–0.2)
Basos: 0 %
EOS (ABSOLUTE): 0.1 x10E3/uL (ref 0.0–0.4)
Eos: 1 %
HCV Ab: NONREACTIVE
HIV Screen 4th Generation wRfx: NONREACTIVE
Hematocrit: 33.8 % — ABNORMAL LOW (ref 34.0–46.6)
Hemoglobin: 11.1 g/dL (ref 11.1–15.9)
Hepatitis B Surface Ag: NEGATIVE
Immature Grans (Abs): 0.1 x10E3/uL (ref 0.0–0.1)
Immature Granulocytes: 1 %
Lymphocytes Absolute: 2.8 x10E3/uL (ref 0.7–3.1)
Lymphs: 30 %
MCH: 27.3 pg (ref 26.6–33.0)
MCHC: 32.8 g/dL (ref 31.5–35.7)
MCV: 83 fL (ref 79–97)
Monocytes Absolute: 0.5 x10E3/uL (ref 0.1–0.9)
Monocytes: 5 %
Neutrophils Absolute: 5.9 x10E3/uL (ref 1.4–7.0)
Neutrophils: 63 %
Platelets: 349 x10E3/uL (ref 150–450)
RBC: 4.06 x10E6/uL (ref 3.77–5.28)
RDW: 13.1 % (ref 11.7–15.4)
RPR Ser Ql: NONREACTIVE
Rh Factor: POSITIVE
Rubella Antibodies, IGG: 3.89 {index}
WBC: 9.3 x10E3/uL (ref 3.4–10.8)

## 2022-02-04 LAB — COMPREHENSIVE METABOLIC PANEL WITH GFR
ALT: 16 IU/L (ref 0–32)
AST: 17 IU/L (ref 0–40)
Albumin/Globulin Ratio: 1.4 (ref 1.2–2.2)
Albumin: 3.8 g/dL — ABNORMAL LOW (ref 3.9–4.9)
Alkaline Phosphatase: 79 IU/L (ref 44–121)
BUN/Creatinine Ratio: 10 (ref 9–23)
BUN: 7 mg/dL (ref 6–20)
Bilirubin Total: <0.2 mg/dL (ref 0.0–1.2)
CO2: 18 mmol/L — ABNORMAL LOW (ref 20–29)
Calcium: 9.5 mg/dL (ref 8.7–10.2)
Chloride: 98 mmol/L (ref 96–106)
Creatinine, Ser: 0.71 mg/dL (ref 0.57–1.00)
Globulin, Total: 2.7 g/dL (ref 1.5–4.5)
Glucose: 117 mg/dL — ABNORMAL HIGH (ref 70–99)
Potassium: 4.1 mmol/L (ref 3.5–5.2)
Sodium: 140 mmol/L (ref 134–144)
Total Protein: 6.5 g/dL (ref 6.0–8.5)
eGFR: 116 mL/min/1.73

## 2022-02-04 LAB — AFP, SERUM, OPEN SPINA BIFIDA
AFP MoM: 0.69
AFP Value: 29 ng/mL
Gest. Age on Collection Date: 20.5 weeks
Maternal Age At EDD: 33.2 yr
OSBR Risk 1 IN: 10000
Test Results:: NEGATIVE
Weight: 366 [lb_av]

## 2022-02-04 LAB — HEMOGLOBIN A1C
Est. average glucose Bld gHb Est-mCnc: 137 mg/dL
Hgb A1c MFr Bld: 6.4 % — ABNORMAL HIGH (ref 4.8–5.6)

## 2022-02-04 LAB — HCV INTERPRETATION

## 2022-02-11 ENCOUNTER — Telehealth: Payer: Self-pay

## 2022-02-11 ENCOUNTER — Encounter: Payer: Self-pay | Admitting: Licensed Clinical Social Worker

## 2022-02-11 DIAGNOSIS — O24410 Gestational diabetes mellitus in pregnancy, diet controlled: Secondary | ICD-10-CM

## 2022-02-11 MED ORDER — ACCU-CHEK SOFTCLIX LANCETS MISC: 1.0000 | Freq: Four times a day (QID) | 12 refills | Status: DC

## 2022-02-11 MED ORDER — GLUCOSE BLOOD VI STRP: ORAL_STRIP | 12 refills | Status: DC

## 2022-02-11 NOTE — Addendum Note (Signed)
Addended by: Truett Mainland on: 02/11/2022 11:34 AM   Modules accepted: Orders

## 2022-02-11 NOTE — Telephone Encounter (Signed)
-----   Message from Truett Mainland, DO sent at 02/11/2022 11:34 AM EDT ----- Refer to diabetes education

## 2022-02-11 NOTE — Telephone Encounter (Signed)
Patient made aware that she has Gestational Diabetes via Mychart. A referral for Diabetes Education was ordered. Marvie Calender l Takaya Hyslop, CMA

## 2022-02-13 ENCOUNTER — Ambulatory Visit (INDEPENDENT_AMBULATORY_CARE_PROVIDER_SITE_OTHER): Payer: Commercial Managed Care - HMO | Admitting: Family Medicine

## 2022-02-13 VITALS — BP 149/86 | HR 103 | Wt 367.0 lb

## 2022-02-13 DIAGNOSIS — O0990 Supervision of high risk pregnancy, unspecified, unspecified trimester: Secondary | ICD-10-CM

## 2022-02-13 DIAGNOSIS — I10 Essential (primary) hypertension: Secondary | ICD-10-CM

## 2022-02-13 MED ORDER — BLOOD GLUCOSE MONITOR KIT: PACK | 0 refills | Status: DC

## 2022-02-13 MED ORDER — LABETALOL HCL 200 MG PO TABS: 200.0000 mg | ORAL_TABLET | Freq: Two times a day (BID) | ORAL | 3 refills | Status: DC

## 2022-02-13 NOTE — Progress Notes (Signed)
Subjective:  Tammie Thomas is a 31 y.o. female here for BP check.   Hypertension ROS: taking medications as instructed, no medication side effects noted, no TIA's, no chest pain on exertion, no dyspnea on exertion, and no swelling of ankles.    Objective:  BP (!) 149/86   Pulse (!) 103   Wt (!) 367 lb (166.5 kg)   LMP 09/07/2021   BMI 57.48 kg/m   Appearance alert, well appearing, and in no distress and oriented to person, place, and time. General exam BP noted to be well controlled today in office.    Assessment:   Blood Pressure needs improvement.   Plan:  Will start labetalol 200mg  BID .

## 2022-02-19 ENCOUNTER — Encounter: Payer: Commercial Managed Care - HMO | Admitting: Licensed Clinical Social Worker

## 2022-02-26 ENCOUNTER — Ambulatory Visit: Payer: Commercial Managed Care - HMO | Admitting: Registered"

## 2022-02-27 ENCOUNTER — Encounter: Payer: Self-pay | Admitting: Family Medicine

## 2022-02-27 ENCOUNTER — Ambulatory Visit: Payer: Commercial Managed Care - HMO | Admitting: *Deleted

## 2022-02-27 ENCOUNTER — Ambulatory Visit (INDEPENDENT_AMBULATORY_CARE_PROVIDER_SITE_OTHER): Payer: Commercial Managed Care - HMO | Admitting: Family Medicine

## 2022-02-27 ENCOUNTER — Other Ambulatory Visit: Payer: Self-pay | Admitting: *Deleted

## 2022-02-27 ENCOUNTER — Ambulatory Visit: Payer: Commercial Managed Care - HMO | Attending: Obstetrics and Gynecology

## 2022-02-27 VITALS — BP 118/81 | HR 103 | Wt 367.0 lb

## 2022-02-27 VITALS — BP 126/78 | HR 101

## 2022-02-27 DIAGNOSIS — O99212 Obesity complicating pregnancy, second trimester: Secondary | ICD-10-CM | POA: Insufficient documentation

## 2022-02-27 DIAGNOSIS — Z8679 Personal history of other diseases of the circulatory system: Secondary | ICD-10-CM | POA: Insufficient documentation

## 2022-02-27 DIAGNOSIS — O35BXX0 Maternal care for other (suspected) fetal abnormality and damage, fetal cardiac anomalies, not applicable or unspecified: Secondary | ICD-10-CM | POA: Diagnosis not present

## 2022-02-27 DIAGNOSIS — Z362 Encounter for other antenatal screening follow-up: Secondary | ICD-10-CM | POA: Insufficient documentation

## 2022-02-27 DIAGNOSIS — O0990 Supervision of high risk pregnancy, unspecified, unspecified trimester: Secondary | ICD-10-CM

## 2022-02-27 DIAGNOSIS — O10912 Unspecified pre-existing hypertension complicating pregnancy, second trimester: Secondary | ICD-10-CM

## 2022-02-27 DIAGNOSIS — Z3A24 24 weeks gestation of pregnancy: Secondary | ICD-10-CM

## 2022-02-27 DIAGNOSIS — E669 Obesity, unspecified: Secondary | ICD-10-CM | POA: Diagnosis not present

## 2022-02-27 DIAGNOSIS — O283 Abnormal ultrasonic finding on antenatal screening of mother: Secondary | ICD-10-CM | POA: Insufficient documentation

## 2022-02-27 DIAGNOSIS — Z6841 Body Mass Index (BMI) 40.0 and over, adult: Secondary | ICD-10-CM

## 2022-02-27 DIAGNOSIS — O10012 Pre-existing essential hypertension complicating pregnancy, second trimester: Secondary | ICD-10-CM | POA: Diagnosis not present

## 2022-02-27 DIAGNOSIS — O0992 Supervision of high risk pregnancy, unspecified, second trimester: Secondary | ICD-10-CM

## 2022-02-27 NOTE — Progress Notes (Signed)
   PRENATAL VISIT NOTE  Subjective:  Tammie Thomas is a 31 y.o. G1P0 at [redacted]w[redacted]d being seen today for ongoing prenatal care.  She is currently monitored for the following issues for this high-risk pregnancy and has Indigestion; Cyst of pancreas; Supervision of high risk pregnancy, antepartum; BMI 50.0-59.9, adult (HCC); History of hypertension; Maternal morbid obesity, antepartum (HCC); and Anxiety on their problem list.  Patient reports  hasn't started her BP medication yet. Was concerned that it would negatively impact baby. Had BP at [place] earlier today - 1st reading high, next one normal. Here, BP normal .  Contractions: Not present. Vag. Bleeding: None.  Movement: Present. Denies leaking of fluid.   The following portions of the patient's history were reviewed and updated as appropriate: allergies, current medications, past family history, past medical history, past social history, past surgical history and problem list.   Objective:   Vitals:   02/27/22 1613 02/27/22 1634  BP: 126/78 118/81  Pulse: (!) 101 (!) 103  Weight: (!) 367 lb (166.5 kg)     Fetal Status: Fetal Heart Rate (bpm): 150   Movement: Present     General:  Alert, oriented and cooperative. Patient is in no acute distress.  Skin: Skin is warm and dry. No rash noted.   Cardiovascular: Normal heart rate noted  Respiratory: Normal respiratory effort, no problems with respiration noted  Abdomen: Soft, gravid, appropriate for gestational age.  Pain/Pressure: Absent     Pelvic: Cervical exam deferred        Extremities: Normal range of motion.  Edema: None  Mental Status: Normal mood and affect. Normal behavior. Normal judgment and thought content.   Assessment and Plan:  Pregnancy: G1P0 at [redacted]w[redacted]d 1. Supervision of high risk pregnancy, antepartum FHT normal  2. History of hypertension Although she had high BP at [place], I think we can hold BP medication for now. Continue ASA 81mg . Serial .  3. Maternal morbid obesity,  antepartum (HCC)  4. BMI 50.0-59.9, adult (HCC)   Preterm labor symptoms and general obstetric precautions including but not limited to vaginal bleeding, contractions, leaking of fluid and fetal movement were reviewed in detail with the patient. Please refer to After Visit Summary for other counseling recommendations.   No follow-ups on file.  Future Appointments  Date Time Provider Department Center  03/31/2022  8:15 AM 14/02/2022, MD [facility] None  03/31/2022  1:15 PM [facility] NURSE [facility]  03/31/2022  1:30 PM [facility] US2 [facility]  04/17/2022  2:30 PM 04/19/2022, DO [facility] None    Levie Heritage, DO

## 2022-03-31 ENCOUNTER — Encounter: Payer: Commercial Managed Care - HMO | Admitting: Obstetrics and Gynecology

## 2022-03-31 ENCOUNTER — Ambulatory Visit: Payer: Commercial Managed Care - HMO

## 2022-03-31 ENCOUNTER — Ambulatory Visit: Payer: Commercial Managed Care - HMO | Attending: Obstetrics

## 2022-03-31 VITALS — BP 158/79 | HR 124

## 2022-03-31 DIAGNOSIS — O0990 Supervision of high risk pregnancy, unspecified, unspecified trimester: Secondary | ICD-10-CM

## 2022-03-31 DIAGNOSIS — O35DXX0 Maternal care for other (suspected) fetal abnormality and damage, fetal gastrointestinal anomalies, not applicable or unspecified: Secondary | ICD-10-CM

## 2022-03-31 DIAGNOSIS — O10912 Unspecified pre-existing hypertension complicating pregnancy, second trimester: Secondary | ICD-10-CM | POA: Diagnosis present

## 2022-03-31 DIAGNOSIS — O10013 Pre-existing essential hypertension complicating pregnancy, third trimester: Secondary | ICD-10-CM

## 2022-03-31 DIAGNOSIS — O99212 Obesity complicating pregnancy, second trimester: Secondary | ICD-10-CM | POA: Insufficient documentation

## 2022-03-31 DIAGNOSIS — E669 Obesity, unspecified: Secondary | ICD-10-CM

## 2022-03-31 DIAGNOSIS — O99213 Obesity complicating pregnancy, third trimester: Secondary | ICD-10-CM

## 2022-03-31 DIAGNOSIS — Z3A29 29 weeks gestation of pregnancy: Secondary | ICD-10-CM

## 2022-04-01 ENCOUNTER — Other Ambulatory Visit: Payer: Self-pay | Admitting: *Deleted

## 2022-04-01 DIAGNOSIS — O283 Abnormal ultrasonic finding on antenatal screening of mother: Secondary | ICD-10-CM

## 2022-04-01 DIAGNOSIS — O10913 Unspecified pre-existing hypertension complicating pregnancy, third trimester: Secondary | ICD-10-CM

## 2022-04-01 DIAGNOSIS — O99213 Obesity complicating pregnancy, third trimester: Secondary | ICD-10-CM

## 2022-04-11 ENCOUNTER — Inpatient Hospital Stay (HOSPITAL_COMMUNITY)
Admission: AD | Admit: 2022-04-11 | Discharge: 2022-04-11 | Disposition: A | Discharge status: Home / Self Care | Payer: Commercial Managed Care - HMO | Attending: Obstetrics and Gynecology | Admitting: Obstetrics and Gynecology

## 2022-04-11 ENCOUNTER — Encounter (HOSPITAL_COMMUNITY): Payer: Self-pay | Admitting: Obstetrics and Gynecology

## 2022-04-11 DIAGNOSIS — Z79899 Other long term (current) drug therapy: Secondary | ICD-10-CM | POA: Diagnosis not present

## 2022-04-11 DIAGNOSIS — Z3A30 30 weeks gestation of pregnancy: Secondary | ICD-10-CM | POA: Diagnosis not present

## 2022-04-11 DIAGNOSIS — O368130 Decreased fetal movements, third trimester, not applicable or unspecified: Secondary | ICD-10-CM | POA: Insufficient documentation

## 2022-04-11 DIAGNOSIS — Z3689 Encounter for other specified antenatal screening: Secondary | ICD-10-CM

## 2022-04-11 DIAGNOSIS — O10913 Unspecified pre-existing hypertension complicating pregnancy, third trimester: Secondary | ICD-10-CM | POA: Diagnosis not present

## 2022-04-11 DIAGNOSIS — Z8249 Family history of ischemic heart disease and other diseases of the circulatory system: Secondary | ICD-10-CM | POA: Diagnosis not present

## 2022-04-11 NOTE — MAU Provider Note (Signed)
History     [id number]  Arrival date and time: 04/11/22 2114    Chief Complaint  Patient presents with   Decreased Fetal Movement     HPI Tammie Thomas is a 31 y.o. at 73w6dwho presents for decreased fetal movement. States for the last few days she has noticed a change in fetal movement. Baby has been "rolling" more than "kicking". Wanted to make sure baby was ok. Denies abdominal pain, headache, visual changes, vaginal bleeding, or LOF. Recently started labetalol for chronic hypertension. Has appointment with OB next week.   OB History     Gravida  1   Para      Term      Preterm      AB      Living         SAB      IAB      Ectopic      Multiple      Live Births              Past Medical History:  Diagnosis Date   Anxiety    Asthma    Cyst of pancreas 03/04/2018   GERD (gastroesophageal reflux disease)    HLD (hyperlipidemia)    Hypertension    Obesity     Past Surgical History:  Procedure Laterality Date   NO PAST SURGERIES      Family History  Problem Relation Age of Onset   Heart disease Mother    Hypertension Mother    Diabetes Father    Hypertension Father     No Known Allergies  No current facility-administered medications on file prior to encounter.   Current Outpatient Medications on File Prior to Encounter  Medication Sig Dispense Refill   Prenatal Vit-Fe Fumarate-FA (PRENATAL VITAMINS PO) Take by mouth.     Accu-Chek Softclix Lancets lancets 1 each by Other route 4 (four) times daily. 100 each 12   aspirin EC 81 MG tablet Take 1 tablet (81 mg total) by mouth daily. Take after 12 weeks for prevention of preeclampsia later in pregnancy (Patient not taking: Reported on 02/27/2022) 300 tablet 2   blood glucose meter kit and supplies KIT Dispense based on patient and insurance preference. Use up to four times daily as directed. 1 each 0   citalopram (CELEXA) 10 MG tablet Take 1 tablet (10 mg total) by mouth daily. (Patient not  taking: Reported on 03/31/2022) 30 tablet 3   Doxylamine-Pyridoxine (DICLEGIS) 10-10 MG TBEC Take 2 tablets by mouth at bedtime. If symptoms persist, add one tablet in the morning and one in the afternoon (Patient not taking: Reported on 03/31/2022) 100 tablet 5   glucose blood test strip Use as instructed 100 each 12   omeprazole (PRILOSEC) 40 MG capsule Take 1 capsule (40 mg total) by mouth daily. (Patient not taking: Reported on 11/28/2021) 30 capsule 2     ROS Pertinent positives and negative per HPI, all others reviewed and negative  Physical Exam   BP 131/67   Pulse (!) 106   Temp 98.3 F (36.8 C)   Resp 18   Ht _0  (1.702 m)   Wt (!) 166.5 kg   LMP 09/07/2021   BMI 57.48 kg/m   Patient Vitals for the past 24 hrs:  BP Temp Pulse Resp Height Weight  04/11/22 2148 131/67 98.3 F (36.8 C) (!) 106 18 -- --  04/11/22 2130 -- -- -- -- _1  (1.702 m) (!) 166.5 kg  Physical Exam Vitals and nursing note reviewed.  Constitutional:      General: She is not in acute distress.    Appearance: Normal appearance.  HENT:     Head: Normocephalic and atraumatic.  Abdominal:     Palpations: Abdomen is soft.     Tenderness: There is no abdominal tenderness.     Comments: gravid  Skin:    General: Skin is warm and dry.  Neurological:     Mental Status: She is alert.  Psychiatric:        Mood and Affect: Mood normal.        Behavior: Behavior normal.      FHT Baseline 150, moderate variability, 15x15 accels, no decels Toco: none Cat: 1  Labs No results found for this or any previous visit (from the past 24 hour(s)).  Imaging No results found.  MAU Course  Procedures Lab Orders  No laboratory test(s) ordered today   No orders of the defined types were placed in this encounter.  Imaging Orders  No imaging studies ordered today    MDM Reactive NST. Patient documented 14 fetal movements during her NST. Patient reassured.  Assessment and Plan   1. Decreased  fetal movements in third trimester, single or unspecified fetus   2. NST (non-stress test) reactive   3. [redacted] weeks gestation of pregnancy    -Reviewed kick counts & reasons to return to MAU -Keep appointment in the office next week   Jorje Guild, NP 04/11/22 10:10 PM

## 2022-04-11 NOTE — MAU Note (Signed)
.  Tammie Thomas is a 31 y.o. at [redacted]w[redacted]d here in MAU reporting: over the past few days she has noticed the baby kicks are not as strong as they have been. Feeling baby roll more than kick. Was worried something was wrong.  Denies any abd pain or cramping. No leaking or bleeding. Pt stated her b/p was a little elevated a home today 140/98 started taking her labetalol LMP:  Onset of complaint: 4-5 days Pain score: 0 Vitals:   04/11/22 2148  BP: 131/67  Pulse: (!) 106  Resp: 18  Temp: 98.3 F (36.8 C)     FHT:160 Lab orders placed from triage:  u/a

## 2022-04-15 ENCOUNTER — Ambulatory Visit: Payer: Self-pay

## 2022-04-15 NOTE — Telephone Encounter (Signed)
Chief Complaint: Nasal congestion Symptoms: Clear and green mucus when blow Frequency: started before Christmas, less than 7 days Pertinent Negatives: Patient denies facial pain, headache Disposition: [] ED /[] Urgent Care (no appt availability in office) / [] Appointment(In office/virtual)/ []  [place] [facility]/ [x] Home Care/ [] Refused Recommended Disposition /[] [place] [facility]/ [x]  Follow-up with PCP Additional Notes: Advised to f/u with OBGYN tomorrow for advice on medications and treatment. She says she's taking Mucinex and asked if ok to take, pharmacist advised it was ok, advised if ok by pharmacist, then yes take, but f/u with OBGYN for clarity. She verbalized understanding.    Reason for Disposition  [1] Sinus congestion as part of a cold AND [2] present < 10 days  Answer Assessment - Initial Assessment Questions 1. LOCATION: "Where does it hurt?"      No pain 2. ONSET: "When did the sinus pain start?"  (e.g., hours, days)      No pain 3. SEVERITY: "How bad is the pain?"   (Scale 1-10; mild, moderate or severe)   - MILD (1-3): doesn't interfere with normal activities    - MODERATE (4-7): interferes with normal activities (e.g., work or school) or awakens from sleep   - SEVERE (8-10): excruciating pain and patient unable to do any normal activities        No pain 4. RECURRENT SYMPTOM: "Have you ever had sinus problems before?" If Yes, ask: "When was the last time?" and "What happened that time?"      N/A 5. NASAL CONGESTION: "Is the nose blocked?" If Yes, ask: "Can you open it or must you breathe through your mouth?"     Yes, breathe out nose 6. NASAL DISCHARGE: "Do you have discharge from your nose?" If so ask, "What color?"     Clear and sometimes clear 7. FEVER: "Do you have a fever?" If Yes, ask: "What is it, how was it measured, and when did it start?"      No 8. OTHER SYMPTOMS: "Do you have any other symptoms?" (e.g., sore throat, cough, earache, difficulty  breathing)     No 9. PREGNANCY: "Is there any chance you are pregnant?" "When was your last menstrual period?"     Yes  Protocols used: Sinus Pain or Congestion-A-AH

## 2022-04-17 ENCOUNTER — Telehealth: Payer: Self-pay

## 2022-04-17 ENCOUNTER — Encounter: Payer: Self-pay | Admitting: General Practice

## 2022-04-17 ENCOUNTER — Ambulatory Visit (INDEPENDENT_AMBULATORY_CARE_PROVIDER_SITE_OTHER): Payer: Commercial Managed Care - HMO | Admitting: Family Medicine

## 2022-04-17 VITALS — BP 152/87 | HR 102 | Wt 364.0 lb

## 2022-04-17 DIAGNOSIS — O0993 Supervision of high risk pregnancy, unspecified, third trimester: Secondary | ICD-10-CM

## 2022-04-17 DIAGNOSIS — I10 Essential (primary) hypertension: Secondary | ICD-10-CM | POA: Insufficient documentation

## 2022-04-17 DIAGNOSIS — E119 Type 2 diabetes mellitus without complications: Secondary | ICD-10-CM

## 2022-04-17 DIAGNOSIS — Z3A31 31 weeks gestation of pregnancy: Secondary | ICD-10-CM

## 2022-04-17 DIAGNOSIS — O99213 Obesity complicating pregnancy, third trimester: Secondary | ICD-10-CM

## 2022-04-17 DIAGNOSIS — O0990 Supervision of high risk pregnancy, unspecified, unspecified trimester: Secondary | ICD-10-CM

## 2022-04-17 MED ORDER — INSULIN ASPART FLEXPEN 100 UNIT/ML ~~LOC~~ SOPN: 15.0000 [IU] | PEN_INJECTOR | Freq: Three times a day (TID) | SUBCUTANEOUS | 3 refills | Status: DC

## 2022-04-17 MED ORDER — LEVEMIR FLEXTOUCH 100 UNIT/ML ~~LOC~~ SOPN: 30.0000 [IU] | PEN_INJECTOR | Freq: Two times a day (BID) | SUBCUTANEOUS | 11 refills | Status: DC

## 2022-04-17 MED ORDER — INSULIN PEN NEEDLE 33G X 5 MM MISC: 1.0000 [IU] | Freq: Every day | 3 refills | Status: DC

## 2022-04-17 MED ORDER — LABETALOL HCL 300 MG PO TABS: 300.0000 mg | ORAL_TABLET | Freq: Two times a day (BID) | ORAL | 3 refills | Status: DC

## 2022-04-17 NOTE — Telephone Encounter (Signed)
error 

## 2022-04-17 NOTE — Progress Notes (Signed)
   PRENATAL VISIT NOTE  Subjective:  Tammie Thomas is a 31 y.o. G1P0 at [redacted]w[redacted]d being seen today for ongoing prenatal care.  She is currently monitored for the following issues for this high-risk pregnancy and has Indigestion; Cyst of pancreas; Supervision of high risk pregnancy, antepartum; BMI 50.0-59.9, adult (HCC); History of hypertension; Maternal morbid obesity, antepartum (HCC); Anxiety; Type 2 diabetes mellitus without complication, without long-term current use of insulin (HCC); and Hypertension on their problem list.  Patient reports no complaints.  Contractions: Not present. Vag. Bleeding: None.  Movement: Present. Denies leaking of fluid.   The following portions of the patient's history were reviewed and updated as appropriate: allergies, current medications, past family history, past medical history, past social history, past surgical history and problem list.   Objective:   Vitals:   04/17/22 0920 04/17/22 0925  BP: (!) 145/88 (!) 152/87  Pulse: (!) 113 (!) 102  Weight: (!) 364 lb (165.1 kg)     Fetal Status: Fetal Heart Rate (bpm): 142   Movement: Present     General:  Alert, oriented and cooperative. Patient is in no acute distress.  Skin: Skin is warm and dry. No rash noted.   Cardiovascular: Normal heart rate noted  Respiratory: Normal respiratory effort, no problems with respiration noted  Abdomen: Soft, gravid, appropriate for gestational age.  Pain/Pressure: Present     Pelvic: Cervical exam deferred        Extremities: Normal range of motion.  Edema: None  Mental Status: Normal mood and affect. Normal behavior. Normal judgment and thought content.   Assessment and Plan:  Pregnancy: G1P0 at [redacted]w[redacted]d 1. [redacted] weeks gestation of pregnancy - CBC - Glucose Tolerance, 2 Hours w/1 Hour - HIV Antibody (routine testing w rflx) - RPR  2. Supervision of high risk pregnancy, antepartum FHT normal  3. Maternal morbid obesity, antepartum (HCC)  4. Hypertension, unspecified  type BP elevated. Start labetalol 300mg  BID  5. Type 2 diabetes mellitus without complication, without long-term current use of insulin (HCC) She has already been checking her CBGs.  Fastings are elevated to 110s and PP 180s-200. Will start medication - discussed metformin vs insulin. She would prefer to start insulin.  Start levemir 30 units BID and aspart 15 units before meals. Discussed hypoglycemia symptoms EFW 98% Will likely need delivery at 37-38 weeks  Preterm labor symptoms and general obstetric precautions including but not limited to vaginal bleeding, contractions, leaking of fluid and fetal movement were reviewed in detail with the patient. Please refer to After Visit Summary for other counseling recommendations.   No follow-ups on file.  Future Appointments  Date Time Provider Department Center  05/02/2022  3:15 PM [facility] NURSE [facility]  05/02/2022  3:30 PM [facility] US3 [facility]  05/07/2022 11:15 AM 05/09/2022, DO [facility] None  05/09/2022  8:15 AM [facility] NURSE [facility]  05/09/2022  8:30 AM [facility] US2 [facility]  05/16/2022  8:45 AM [facility] NURSE [facility]  05/16/2022  9:00 AM [facility] US1 [facility]  05/22/2022  2:50 PM 07/21/2022, DO [facility] None  05/23/2022  8:45 AM [facility] NURSE [facility]  05/23/2022  9:00 AM [facility] US1 [facility]  05/29/2022  3:10 PM 07/28/2022, DO [facility] None  06/06/2022 11:15 AM 06/08/2022, DO [facility] None  06/12/2022  3:30 PM 06/14/2022, DO [facility] None    Levie Heritage, DO

## 2022-04-18 LAB — CBC
Hematocrit: 34.3 % (ref 34.0–46.6)
Hemoglobin: 11.3 g/dL (ref 11.1–15.9)
MCH: 26.9 pg (ref 26.6–33.0)
MCHC: 32.9 g/dL (ref 31.5–35.7)
MCV: 82 fL (ref 79–97)
Platelets: 354 10*3/uL (ref 150–450)
RBC: 4.2 x10E6/uL (ref 3.77–5.28)
RDW: 12.5 % (ref 11.7–15.4)
WBC: 6.9 10*3/uL (ref 3.4–10.8)

## 2022-04-18 LAB — GLUCOSE TOLERANCE, 2 HOURS W/ 1HR
Glucose, 1 hour: 298 mg/dL — ABNORMAL HIGH (ref 70–179)
Glucose, 2 hour: 280 mg/dL — ABNORMAL HIGH (ref 70–152)
Glucose, Fasting: 135 mg/dL — ABNORMAL HIGH (ref 70–91)

## 2022-04-18 LAB — HIV ANTIBODY (ROUTINE TESTING W REFLEX): HIV Screen 4th Generation wRfx: NONREACTIVE

## 2022-04-18 LAB — RPR: RPR Ser Ql: NONREACTIVE

## 2022-04-28 ENCOUNTER — Encounter (HOSPITAL_COMMUNITY): Payer: Self-pay | Admitting: Family Medicine

## 2022-04-28 ENCOUNTER — Inpatient Hospital Stay (HOSPITAL_COMMUNITY)
Admission: AD | Admit: 2022-04-28 | Discharge: 2022-04-28 | Disposition: A | Discharge status: Home / Self Care | Payer: Commercial Managed Care - HMO | Attending: Family Medicine | Admitting: Family Medicine

## 2022-04-28 DIAGNOSIS — Z794 Long term (current) use of insulin: Secondary | ICD-10-CM | POA: Insufficient documentation

## 2022-04-28 DIAGNOSIS — O4703 False labor before 37 completed weeks of gestation, third trimester: Secondary | ICD-10-CM

## 2022-04-28 DIAGNOSIS — I10 Essential (primary) hypertension: Secondary | ICD-10-CM

## 2022-04-28 DIAGNOSIS — R42 Dizziness and giddiness: Secondary | ICD-10-CM | POA: Insufficient documentation

## 2022-04-28 DIAGNOSIS — O99213 Obesity complicating pregnancy, third trimester: Secondary | ICD-10-CM | POA: Insufficient documentation

## 2022-04-28 DIAGNOSIS — E119 Type 2 diabetes mellitus without complications: Secondary | ICD-10-CM

## 2022-04-28 DIAGNOSIS — O26893 Other specified pregnancy related conditions, third trimester: Secondary | ICD-10-CM | POA: Diagnosis present

## 2022-04-28 DIAGNOSIS — Z79899 Other long term (current) drug therapy: Secondary | ICD-10-CM | POA: Insufficient documentation

## 2022-04-28 DIAGNOSIS — O24113 Pre-existing diabetes mellitus, type 2, in pregnancy, third trimester: Secondary | ICD-10-CM | POA: Insufficient documentation

## 2022-04-28 DIAGNOSIS — M7989 Other specified soft tissue disorders: Secondary | ICD-10-CM | POA: Diagnosis not present

## 2022-04-28 DIAGNOSIS — O10913 Unspecified pre-existing hypertension complicating pregnancy, third trimester: Secondary | ICD-10-CM | POA: Insufficient documentation

## 2022-04-28 DIAGNOSIS — R609 Edema, unspecified: Secondary | ICD-10-CM | POA: Diagnosis not present

## 2022-04-28 DIAGNOSIS — Z3A33 33 weeks gestation of pregnancy: Secondary | ICD-10-CM | POA: Diagnosis not present

## 2022-04-28 LAB — URINALYSIS, ROUTINE W REFLEX MICROSCOPIC
Bilirubin Urine: NEGATIVE
Glucose, UA: NEGATIVE mg/dL
Hgb urine dipstick: NEGATIVE
Ketones, ur: NEGATIVE mg/dL
Leukocytes,Ua: NEGATIVE
Nitrite: NEGATIVE
Protein, ur: NEGATIVE mg/dL
Specific Gravity, Urine: 1.009 (ref 1.005–1.030)
pH: 6 (ref 5.0–8.0)

## 2022-04-28 LAB — WET PREP, GENITAL
Clue Cells Wet Prep HPF POC: NONE SEEN
Sperm: NONE SEEN
Trich, Wet Prep: NONE SEEN
WBC, Wet Prep HPF POC: <10 (ref <10)
Yeast Wet Prep HPF POC: NONE SEEN

## 2022-04-28 MED ORDER — NIFEDIPINE ER OSMOTIC RELEASE 30 MG PO TB24: 30.0000 mg | ORAL_TABLET | Freq: Every day | ORAL | 11 refills | Status: DC

## 2022-04-28 MED ORDER — NIFEDIPINE 10 MG PO CAPS
10.0000 mg | ORAL_CAPSULE | ORAL | Status: AC | PRN
  Administered 2022-04-28 (×3): 10 mg via ORAL
  Filled 2022-04-28 (×3): qty 1

## 2022-04-28 MED ORDER — NIFEDIPINE ER OSMOTIC RELEASE 30 MG PO TB24: 30.0000 mg | ORAL_TABLET | Freq: Two times a day (BID) | ORAL | 3 refills | Status: DC

## 2022-04-28 MED ORDER — LEVEMIR FLEXTOUCH 100 UNIT/ML ~~LOC~~ SOPN: 30.0000 [IU] | PEN_INJECTOR | Freq: Every day | SUBCUTANEOUS | 11 refills | Status: DC

## 2022-04-28 MED ORDER — TERBUTALINE SULFATE 1 MG/ML IJ SOLN
0.2500 mg | Freq: Once | INTRAMUSCULAR | Status: AC
  Administered 2022-04-28: 0.25 mg via SUBCUTANEOUS
  Filled 2022-04-28: qty 1

## 2022-04-28 NOTE — Discharge Instructions (Signed)
Take Levemir 30 units at night Stop labetalol and start Procardia 30mg  XL twice a day.

## 2022-04-28 NOTE — MAU Provider Note (Signed)
History     CSN: [id number]  Arrival date and time: 04/28/22 1341    Chief Complaint  Patient presents with   Abdominal Pain   Edema   HPI This is a 32 year old G1 P0 at 33 weeks and 2 days with pregnancy complicated by hypertension, diabetes, obesity.  Patient presents with swelling in her hands and feet that started earlier today.  She has been taking her labetalol.  She finds that she is dizzy about 20 minutes after taking the labetalol which last for a few hours.  Additionally, she has been having tightening in the lower abdomen that started a couple hours ago.  She has good fetal movement.  No leaking.  No vaginal bleeding.  She has not had sex recently (reports no sex for 5 months)  Also reports that her blood sugars have been better controlled - hasn't started the insulin yet. Fasting blood sugars are 120s. PP blood sugars are 80-90.  OB History     Gravida  1   Para      Term      Preterm      AB      Living         SAB      IAB      Ectopic      Multiple      Live Births              Past Medical History:  Diagnosis Date   Anxiety    Asthma    Cyst of pancreas 03/04/2018   GERD (gastroesophageal reflux disease)    HLD (hyperlipidemia)    Hypertension    Obesity     Past Surgical History:  Procedure Laterality Date   NO PAST SURGERIES      Family History  Problem Relation Age of Onset   Heart disease Mother    Hypertension Mother    Diabetes Father    Hypertension Father     Social History   Tobacco Use   Smoking status: Former    Types: Cigarettes    Quit date: 02/25/2008    Years since quitting: 14.1   Smokeless tobacco: Never  Vaping Use   Vaping Use: Never used  Substance Use Topics   Alcohol use: Not Currently   Drug use: No    Allergies: No Known Allergies  Medications Prior to Admission  Medication Sig Dispense Refill Last Dose   glucose blood test strip Use as instructed 100 each 12 04/28/2022   labetalol  (NORMODYNE) 300 MG tablet Take 1 tablet (300 mg total) by mouth 2 (two) times daily. 60 tablet 3 04/28/2022   Prenatal Vit-Fe Fumarate-FA (PRENATAL VITAMINS PO) Take by mouth.   04/28/2022   Accu-Chek Softclix Lancets lancets 1 each by Other route 4 (four) times daily. 100 each 12    aspirin EC 81 MG tablet Take 1 tablet (81 mg total) by mouth daily. Take after 12 weeks for prevention of preeclampsia later in pregnancy (Patient not taking: Reported on 02/27/2022) 300 tablet 2    blood glucose meter kit and supplies KIT Dispense based on patient and insurance preference. Use up to four times daily as directed. 1 each 0    citalopram (CELEXA) 10 MG tablet Take 1 tablet (10 mg total) by mouth daily. (Patient not taking: Reported on 03/31/2022) 30 tablet 3    Doxylamine-Pyridoxine (DICLEGIS) 10-10 MG TBEC Take 2 tablets by mouth at bedtime. If symptoms persist, add one tablet in the morning  and one in the afternoon (Patient not taking: Reported on 03/31/2022) 100 tablet 5    Insulin Aspart FlexPen (NOVOLOG) 100 UNIT/ML Inject 15 Units into the skin 3 (three) times daily before meals. 15 mL 3    insulin detemir (LEVEMIR FLEXTOUCH) 100 UNIT/ML FlexPen Inject 30 Units into the skin 2 (two) times daily. 15 mL 11    Insulin Pen Needle 33G X 5 MM MISC 1 Units by Does not apply route 5 (five) times daily. 100 each 3    omeprazole (PRILOSEC) 40 MG capsule Take 1 capsule (40 mg total) by mouth daily. (Patient not taking: Reported on 11/28/2021) 30 capsule 2     Review of Systems Physical Exam   Blood pressure 134/85, pulse (!) 101, temperature 97.9 F (36.6 C), temperature source Oral, resp. rate 17, height 5\' 7"  (1.702 m), weight (!) 171.3 kg, last menstrual period 09/07/2021, SpO2 100 %.  Physical Exam Vitals reviewed. Exam conducted with a chaperone present.  Constitutional:      Appearance: She is well-developed.  Abdominal:     General: Abdomen is flat and scaphoid.     Palpations: Abdomen is soft.   Skin:    General: Skin is warm and dry.  Neurological:     Mental Status: She is alert.    Dilation: Closed Effacement (%): Thick Exam by:: Loma Boston, DO  MAU Course  Procedures NST:  Baseline: 140s  Variability: moderate Accelerations: present  Decelerations: none Contractions: every 3 minutes, which stopped after terbutaline.   MDM Will give nifedipine 10mg  IR x3. Contractions slowed down, but not stopped. Will give terbutaline.  Assessment and Plan   1. Preterm uterine contractions in third trimester, antepartum   2. Hypertension, unspecified type   3. Type 2 diabetes mellitus without complication, without long-term current use of insulin (HCC)    Contractions stopped. Discharge to home. Return precautions given.  Changed insulin - just levemir at bedtime [place]agne labetalol to procardia 30mg  XL twice a day.  Truett Mainland 04/28/2022, 2:53 PM

## 2022-04-28 NOTE — MAU Note (Signed)
...  Tammie Thomas is a 32 y.o. at [redacted]w[redacted]d here in MAU reporting: Over the weekend she started noticing swelling in her hands and feet. She reports she has been experiencing swelling during her entire pregnancy but it has not gone down since then. She reports she has been wearing compression socks. She reports she has gestational diabetes and hypertension and wants to ensure everything is okay. She also reports feeling occasional abdominal tightness that is not painful. Denies pain, VB or LOF. +FM.  Pain score: Denies pain.  FHT: 155 initial external Lab orders placed from triage:  UA

## 2022-04-29 LAB — GC/CHLAMYDIA PROBE AMP (~~LOC~~) NOT AT ARMC
Chlamydia: NEGATIVE
Comment: NEGATIVE
Comment: NORMAL
Neisseria Gonorrhea: NEGATIVE

## 2022-04-30 ENCOUNTER — Encounter: Payer: Self-pay | Admitting: Family Medicine

## 2022-05-02 ENCOUNTER — Ambulatory Visit: Payer: Medicaid Other | Attending: Obstetrics

## 2022-05-02 ENCOUNTER — Ambulatory Visit: Payer: Medicaid Other | Admitting: *Deleted

## 2022-05-02 VITALS — BP 146/101 | HR 108

## 2022-05-02 DIAGNOSIS — O0990 Supervision of high risk pregnancy, unspecified, unspecified trimester: Secondary | ICD-10-CM

## 2022-05-02 DIAGNOSIS — O35BXX0 Maternal care for other (suspected) fetal abnormality and damage, fetal cardiac anomalies, not applicable or unspecified: Secondary | ICD-10-CM

## 2022-05-02 DIAGNOSIS — O99213 Obesity complicating pregnancy, third trimester: Secondary | ICD-10-CM | POA: Diagnosis present

## 2022-05-02 DIAGNOSIS — O10913 Unspecified pre-existing hypertension complicating pregnancy, third trimester: Secondary | ICD-10-CM | POA: Insufficient documentation

## 2022-05-02 DIAGNOSIS — Z794 Long term (current) use of insulin: Secondary | ICD-10-CM

## 2022-05-02 DIAGNOSIS — Z3A33 33 weeks gestation of pregnancy: Secondary | ICD-10-CM

## 2022-05-02 DIAGNOSIS — O283 Abnormal ultrasonic finding on antenatal screening of mother: Secondary | ICD-10-CM | POA: Insufficient documentation

## 2022-05-02 DIAGNOSIS — E669 Obesity, unspecified: Secondary | ICD-10-CM

## 2022-05-02 DIAGNOSIS — E119 Type 2 diabetes mellitus without complications: Secondary | ICD-10-CM

## 2022-05-02 DIAGNOSIS — O24113 Pre-existing diabetes mellitus, type 2, in pregnancy, third trimester: Secondary | ICD-10-CM

## 2022-05-02 DIAGNOSIS — O10013 Pre-existing essential hypertension complicating pregnancy, third trimester: Secondary | ICD-10-CM

## 2022-05-07 ENCOUNTER — Encounter: Payer: Commercial Managed Care - HMO | Admitting: Family Medicine

## 2022-05-08 ENCOUNTER — Encounter (HOSPITAL_COMMUNITY): Payer: Self-pay | Admitting: Obstetrics & Gynecology

## 2022-05-08 ENCOUNTER — Observation Stay (HOSPITAL_COMMUNITY)
Admission: AD | Admit: 2022-05-08 | Discharge: 2022-05-09 | Disposition: A | Discharge status: Home / Self Care | Payer: Medicaid Other | Attending: Obstetrics & Gynecology | Admitting: Obstetrics & Gynecology

## 2022-05-08 DIAGNOSIS — O0993 Supervision of high risk pregnancy, unspecified, third trimester: Principal | ICD-10-CM | POA: Insufficient documentation

## 2022-05-08 DIAGNOSIS — O24113 Pre-existing diabetes mellitus, type 2, in pregnancy, third trimester: Secondary | ICD-10-CM | POA: Diagnosis not present

## 2022-05-08 DIAGNOSIS — E119 Type 2 diabetes mellitus without complications: Secondary | ICD-10-CM | POA: Insufficient documentation

## 2022-05-08 DIAGNOSIS — J45909 Unspecified asthma, uncomplicated: Secondary | ICD-10-CM | POA: Insufficient documentation

## 2022-05-08 DIAGNOSIS — O10919 Unspecified pre-existing hypertension complicating pregnancy, unspecified trimester: Secondary | ICD-10-CM | POA: Diagnosis present

## 2022-05-08 DIAGNOSIS — Z3A34 34 weeks gestation of pregnancy: Secondary | ICD-10-CM | POA: Diagnosis not present

## 2022-05-08 DIAGNOSIS — O99213 Obesity complicating pregnancy, third trimester: Secondary | ICD-10-CM | POA: Diagnosis not present

## 2022-05-08 DIAGNOSIS — Z87891 Personal history of nicotine dependence: Secondary | ICD-10-CM | POA: Diagnosis not present

## 2022-05-08 DIAGNOSIS — O0990 Supervision of high risk pregnancy, unspecified, unspecified trimester: Secondary | ICD-10-CM

## 2022-05-08 DIAGNOSIS — E669 Obesity, unspecified: Secondary | ICD-10-CM | POA: Insufficient documentation

## 2022-05-08 DIAGNOSIS — O10013 Pre-existing essential hypertension complicating pregnancy, third trimester: Secondary | ICD-10-CM | POA: Diagnosis not present

## 2022-05-08 HISTORY — DX: Gestational diabetes mellitus in pregnancy, unspecified control: O24.419

## 2022-05-08 LAB — COMPREHENSIVE METABOLIC PANEL
ALT: 25 U/L (ref 0–44)
AST: 22 U/L (ref 15–41)
Albumin: 2.6 g/dL — ABNORMAL LOW (ref 3.5–5.0)
Alkaline Phosphatase: 130 U/L — ABNORMAL HIGH (ref 38–126)
Anion gap: 10 (ref 5–15)
BUN: 9 mg/dL (ref 6–20)
CO2: 18 mmol/L — ABNORMAL LOW (ref 22–32)
Calcium: 9.6 mg/dL (ref 8.9–10.3)
Chloride: 105 mmol/L (ref 98–111)
Creatinine, Ser: 0.77 mg/dL (ref 0.44–1.00)
GFR, Estimated: >60 mL/min (ref >60)
Glucose, Bld: 117 mg/dL — ABNORMAL HIGH (ref 70–99)
Potassium: 4 mmol/L (ref 3.5–5.1)
Sodium: 133 mmol/L — ABNORMAL LOW (ref 135–145)
Total Bilirubin: <0.1 mg/dL — ABNORMAL LOW (ref 0.3–1.2)
Total Protein: 6.4 g/dL — ABNORMAL LOW (ref 6.5–8.1)

## 2022-05-08 LAB — CBC
HCT: 32.6 % — ABNORMAL LOW (ref 36.0–46.0)
Hemoglobin: 11.2 g/dL — ABNORMAL LOW (ref 12.0–15.0)
MCH: 27.5 pg (ref 26.0–34.0)
MCHC: 34.4 g/dL (ref 30.0–36.0)
MCV: 79.9 fL — ABNORMAL LOW (ref 80.0–100.0)
Platelets: 337 10*3/uL (ref 150–400)
RBC: 4.08 MIL/uL (ref 3.87–5.11)
RDW: 13.5 % (ref 11.5–15.5)
WBC: 8.3 10*3/uL (ref 4.0–10.5)
nRBC: 0 % (ref 0.0–0.2)

## 2022-05-08 LAB — URINALYSIS, ROUTINE W REFLEX MICROSCOPIC
Bacteria, UA: NONE SEEN
Bilirubin Urine: NEGATIVE
Glucose, UA: NEGATIVE mg/dL
Ketones, ur: NEGATIVE mg/dL
Leukocytes,Ua: NEGATIVE
Nitrite: NEGATIVE
Protein, ur: 30 mg/dL — AB
Specific Gravity, Urine: 1.009 (ref 1.005–1.030)
pH: 7 (ref 5.0–8.0)

## 2022-05-08 LAB — PROTEIN / CREATININE RATIO, URINE
Creatinine, Urine: 121 mg/dL
Protein Creatinine Ratio: 0.22 mg/mg{Cre} — ABNORMAL HIGH (ref 0.00–0.15)
Total Protein, Urine: 27 mg/dL

## 2022-05-08 MED ORDER — FAMOTIDINE 20 MG PO TABS
20.0000 mg | ORAL_TABLET | Freq: Once | ORAL | Status: AC
  Administered 2022-05-08: 20 mg via ORAL
  Filled 2022-05-08: qty 1

## 2022-05-08 MED ORDER — LABETALOL HCL 5 MG/ML IV SOLN
40.0000 mg | INTRAVENOUS | Status: DC | PRN
  Administered 2022-05-09: 40 mg via INTRAVENOUS
  Filled 2022-05-08: qty 8

## 2022-05-08 MED ORDER — LABETALOL HCL 5 MG/ML IV SOLN: 80.0000 mg | INTRAVENOUS | Status: DC | PRN

## 2022-05-08 MED ORDER — NIFEDIPINE ER OSMOTIC RELEASE 30 MG PO TB24
30.0000 mg | ORAL_TABLET | Freq: Once | ORAL | Status: AC
  Administered 2022-05-08: 30 mg via ORAL
  Filled 2022-05-08: qty 1

## 2022-05-08 MED ORDER — HYDRALAZINE HCL 20 MG/ML IJ SOLN: 10.0000 mg | INTRAMUSCULAR | Status: DC | PRN

## 2022-05-08 MED ORDER — LABETALOL HCL 5 MG/ML IV SOLN
20.0000 mg | INTRAVENOUS | Status: DC | PRN
  Administered 2022-05-09: 20 mg via INTRAVENOUS
  Filled 2022-05-08: qty 4

## 2022-05-08 NOTE — MAU Provider Note (Signed)
History     CSN: [id number]  Arrival date and time: 05/08/22 2136   Event Date/Time   First Provider Initiated Contact with Patient 05/08/22 2221      Chief Complaint  Patient presents with   Decreased Fetal Movement   Hypertension   Tammie Thomas is a 32 y.o. G1P0 at [redacted]w[redacted]d who presents today with increased BP at home and decreased fetal movement. She states that she has been taking her blood pressure at home and it has been in the 140s/90s for the last week. She has CHTN and is currently on procardia 30XL BID. She states that she never misses a morning dose, but occasionally will miss an evening dose if she falls asleep too early. She has taken her evening dose today. She states that she will get 10 fetal movements in one hour if she is counting, but movement seemed less today. She is also having occasional contractions, but states that she has not really felt any contractions since she has been here. She denies any HA, visual disturbance or RUQ pain.   Hypertension    OB History     Gravida  1   Para      Term      Preterm      AB      Living         SAB      IAB      Ectopic      Multiple      Live Births              Past Medical History:  Diagnosis Date   Anxiety    Asthma    Cyst of pancreas 03/04/2018   GERD (gastroesophageal reflux disease)    Gestational diabetes    HLD (hyperlipidemia)    Hypertension    Obesity     Past Surgical History:  Procedure Laterality Date   NO PAST SURGERIES      Family History  Problem Relation Age of Onset   Heart disease Mother    Hypertension Mother    Diabetes Father    Hypertension Father     Social History   Tobacco Use   Smoking status: Former    Types: Cigarettes    Quit date: 02/25/2008    Years since quitting: 14.2   Smokeless tobacco: Never  Vaping Use   Vaping Use: Never used  Substance Use Topics   Alcohol use: Not Currently   Drug use: No    Allergies: No Known  Allergies  Medications Prior to Admission  Medication Sig Dispense Refill Last Dose   insulin detemir (LEVEMIR FLEXTOUCH) 100 UNIT/ML FlexPen Inject 30 Units into the skin at bedtime. 15 mL 11 05/07/2022   NIFEdipine (PROCARDIA-XL/NIFEDICAL-XL) 30 MG 24 hr tablet Take 1 tablet (30 mg total) by mouth in the morning and at bedtime. 60 tablet 3 05/08/2022   Prenatal Vit-Fe Fumarate-FA (PRENATAL VITAMINS PO) Take by mouth.   05/07/2022   Accu-Chek Softclix Lancets lancets 1 each by Other route 4 (four) times daily. 100 each 12    aspirin EC 81 MG tablet Take 1 tablet (81 mg total) by mouth daily. Take after 12 weeks for prevention of preeclampsia later in pregnancy (Patient not taking: Reported on 02/27/2022) 300 tablet 2    blood glucose meter kit and supplies KIT Dispense based on patient and insurance preference. Use up to four times daily as directed. 1 each 0    citalopram (CELEXA) 10 MG tablet  Take 1 tablet (10 mg total) by mouth daily. (Patient not taking: Reported on 03/31/2022) 30 tablet 3    glucose blood test strip Use as instructed 100 each 12    Insulin Pen Needle 33G X 5 MM MISC 1 Units by Does not apply route 5 (five) times daily. 100 each 3     Review of Systems  All other systems reviewed and are negative.  Physical Exam   Blood pressure (!) 167/108, pulse (!) 103, temperature 98.1 F (36.7 C), resp. rate 20, height 5\' 7"  (1.702 m), weight (!) 171.5 kg, last menstrual period 09/07/2021, SpO2 100 %.  Physical Exam Constitutional:      Appearance: She is well-developed.  HENT:     Head: Normocephalic.  Eyes:     Pupils: Pupils are equal, round, and reactive to light.  Cardiovascular:     Rate and Rhythm: Normal rate.  Pulmonary:     Effort: Pulmonary effort is normal. No respiratory distress.  Abdominal:     Palpations: Abdomen is soft.     Tenderness: There is no abdominal tenderness.  Genitourinary:    Vagina: No bleeding. Vaginal discharge: mucusy.    Comments:  External: no lesion Vagina: small amount of white discharge Dilation: Closed Exam by:: Marcille Buffy, CNM      Musculoskeletal:        General: Normal range of motion.     Cervical back: Normal range of motion and neck supple.  Skin:    General: Skin is warm and dry.  Neurological:     Mental Status: She is alert and oriented to person, place, and time.  Psychiatric:        Mood and Affect: Mood normal.        Behavior: Behavior normal.     NST:  Baseline: 145 Variability: moderate Accels: 15x15 Decels: none Toco: every 3 mins, but patient denies feeling any contractions Reactive/Appropriate for GA  Patient Vitals for the past 24 hrs:  BP Temp Pulse Resp SpO2 Height Weight  05/09/22 0015 (!) 167/108 -- (!) 103 -- 100 % -- --  05/09/22 0000 (!) 161/107 -- 88 -- -- -- --  05/08/22 2345 (!) 151/95 -- 89 -- -- -- --  05/08/22 2330 (!) 154/96 -- 93 -- -- -- --  05/08/22 2323 (!) 154/107 -- 98 -- -- -- --  05/08/22 2247 (!) 155/95 -- 100 -- -- -- --  05/08/22 2232 (!) 153/104 -- (!) 102 -- -- -- --  05/08/22 2217 (!) 139/100 -- 97 -- -- -- --  05/08/22 2212 (!) 140/89 -- (!) 104 -- -- -- --  05/08/22 2152 (!) 146/101 -- -- -- -- -- --  05/08/22 2149 -- 98.1 F (36.7 C) (!) 103 20 100 % 5\' 7"  (1.702 m) (!) 171.5 kg   Results for orders placed or performed during the hospital encounter of 05/08/22 (from the past 24 hour(s))  Urinalysis, Routine w reflex microscopic Urine, Clean Catch     Status: Abnormal   Collection Time: 05/08/22 10:19 PM  Result Value Ref Range   Color, Urine YELLOW YELLOW   APPearance CLEAR CLEAR   Specific Gravity, Urine 1.009 1.005 - 1.030   pH 7.0 5.0 - 8.0   Glucose, UA NEGATIVE NEGATIVE mg/dL   Hgb urine dipstick SMALL (A) NEGATIVE   Bilirubin Urine NEGATIVE NEGATIVE   Ketones, ur NEGATIVE NEGATIVE mg/dL   Protein, ur 30 (A) NEGATIVE mg/dL   Nitrite NEGATIVE NEGATIVE   Leukocytes,Ua NEGATIVE NEGATIVE  RBC / HPF 0-5 0 - 5 RBC/hpf   WBC, UA  0-5 0 - 5 WBC/hpf   Bacteria, UA NONE SEEN NONE SEEN   Squamous Epithelial / HPF 0-5 0 - 5 /HPF  Protein / creatinine ratio, urine     Status: Abnormal   Collection Time: 05/08/22 10:19 PM  Result Value Ref Range   Creatinine, Urine 121 mg/dL   Total Protein, Urine 27 mg/dL   Protein Creatinine Ratio 0.22 (H) 0.00 - 0.15 mg/mg[Cre]  Comprehensive metabolic panel     Status: Abnormal   Collection Time: 05/08/22 10:41 PM  Result Value Ref Range   Sodium 133 (L) 135 - 145 mmol/L   Potassium 4.0 3.5 - 5.1 mmol/L   Chloride 105 98 - 111 mmol/L   CO2 18 (L) 22 - 32 mmol/L   Glucose, Bld 117 (H) 70 - 99 mg/dL   BUN 9 6 - 20 mg/dL   Creatinine, Ser 5.17 0.44 - 1.00 mg/dL   Calcium 9.6 8.9 - 61.6 mg/dL   Total Protein 6.4 (L) 6.5 - 8.1 g/dL   Albumin 2.6 (L) 3.5 - 5.0 g/dL   AST 22 15 - 41 U/L   ALT 25 0 - 44 U/L   Alkaline Phosphatase 130 (H) 38 - 126 U/L   Total Bilirubin <0.1 (L) 0.3 - 1.2 mg/dL   GFR, Estimated >07 >37 mL/min   Anion gap 10 5 - 15  CBC     Status: Abnormal   Collection Time: 05/08/22 10:41 PM  Result Value Ref Range   WBC 8.3 4.0 - 10.5 K/uL   RBC 4.08 3.87 - 5.11 MIL/uL   Hemoglobin 11.2 (L) 12.0 - 15.0 g/dL   HCT 10.6 (L) 26.9 - 48.5 %   MCV 79.9 (L) 80.0 - 100.0 fL   MCH 27.5 26.0 - 34.0 pg   MCHC 34.4 30.0 - 36.0 g/dL   RDW 46.2 70.3 - 50.0 %   Platelets 337 150 - 400 K/uL   nRBC 0.0 0.0 - 0.2 %      MAU Course  Procedures  MDM DW Patient that we will increase procardia to 60XL BID. She has an appt with MFM in the morning.  At 0000 patient had 161/107 blood pressure. When went in the room to check on the patient she was asleep and said her arm with the blood pressure cuff had been tucked under her and she was laying on the cuff/her arm. On recheck BP was 167/108  Patient's cervix closed despite contractions. She is also not feeling them and is resting comfortably.  0026:DW Dr. Charlotta Newton will obs on OBSC for BP management.   Assessment and Plan   Chronic Hypertension  Admit to [facility] for BP management   Thressa Sheller DNP, CNM  05/09/22  12:33 AM

## 2022-05-08 NOTE — MAU Note (Signed)
.  Tammie Thomas is a 32 y.o. at [redacted]w[redacted]d here in MAU reporting HTN and decreased FM. Has felt baby move today but not as much as usual. Denies any pain. No VB or LOF  Onset of complaint: today Pain score: 0 Vitals:   05/08/22 2149 05/08/22 2152  BP:  (!) 160/105  Pulse: (!) 103   Resp: 20   Temp: 98.1 F (36.7 C)   SpO2: 100%      FHT:142 Lab orders placed from triage:  none

## 2022-05-09 ENCOUNTER — Observation Stay (HOSPITAL_BASED_OUTPATIENT_CLINIC_OR_DEPARTMENT_OTHER): Payer: Medicaid Other

## 2022-05-09 ENCOUNTER — Ambulatory Visit: Payer: Commercial Managed Care - HMO

## 2022-05-09 ENCOUNTER — Other Ambulatory Visit: Payer: Self-pay

## 2022-05-09 DIAGNOSIS — O0993 Supervision of high risk pregnancy, unspecified, third trimester: Secondary | ICD-10-CM | POA: Diagnosis not present

## 2022-05-09 DIAGNOSIS — O99213 Obesity complicating pregnancy, third trimester: Secondary | ICD-10-CM | POA: Diagnosis not present

## 2022-05-09 DIAGNOSIS — O358XX0 Maternal care for other (suspected) fetal abnormality and damage, not applicable or unspecified: Secondary | ICD-10-CM | POA: Diagnosis not present

## 2022-05-09 DIAGNOSIS — O10013 Pre-existing essential hypertension complicating pregnancy, third trimester: Secondary | ICD-10-CM

## 2022-05-09 DIAGNOSIS — E669 Obesity, unspecified: Secondary | ICD-10-CM

## 2022-05-09 DIAGNOSIS — O24113 Pre-existing diabetes mellitus, type 2, in pregnancy, third trimester: Secondary | ICD-10-CM

## 2022-05-09 DIAGNOSIS — E119 Type 2 diabetes mellitus without complications: Secondary | ICD-10-CM

## 2022-05-09 DIAGNOSIS — O10919 Unspecified pre-existing hypertension complicating pregnancy, unspecified trimester: Secondary | ICD-10-CM | POA: Diagnosis present

## 2022-05-09 DIAGNOSIS — Z794 Long term (current) use of insulin: Secondary | ICD-10-CM

## 2022-05-09 DIAGNOSIS — Z3A34 34 weeks gestation of pregnancy: Secondary | ICD-10-CM

## 2022-05-09 DIAGNOSIS — O99891 Other specified diseases and conditions complicating pregnancy: Secondary | ICD-10-CM

## 2022-05-09 DIAGNOSIS — Z87891 Personal history of nicotine dependence: Secondary | ICD-10-CM

## 2022-05-09 LAB — TYPE AND SCREEN
ABO/RH(D): A POS
Antibody Screen: NEGATIVE

## 2022-05-09 LAB — GLUCOSE, CAPILLARY
Glucose-Capillary: 109 mg/dL — ABNORMAL HIGH (ref 70–99)
Glucose-Capillary: 128 mg/dL — ABNORMAL HIGH (ref 70–99)
Glucose-Capillary: 141 mg/dL — ABNORMAL HIGH (ref 70–99)

## 2022-05-09 MED ORDER — PRENATAL MULTIVITAMIN CH
1.0000 | ORAL_TABLET | Freq: Every day | ORAL | Status: DC
  Administered 2022-05-09: 1 via ORAL
  Filled 2022-05-09: qty 1

## 2022-05-09 MED ORDER — LACTATED RINGERS IV SOLN: 125.0000 mL/h | INTRAVENOUS | Status: DC

## 2022-05-09 MED ORDER — PANTOPRAZOLE SODIUM 20 MG PO TBEC: 20.0000 mg | DELAYED_RELEASE_TABLET | Freq: Every day | ORAL | 1 refills | Status: DC

## 2022-05-09 MED ORDER — DOCUSATE SODIUM 100 MG PO CAPS: 100.0000 mg | ORAL_CAPSULE | Freq: Every day | ORAL | Status: DC

## 2022-05-09 MED ORDER — NIFEDIPINE ER 60 MG PO TB24: 60.0000 mg | ORAL_TABLET | Freq: Two times a day (BID) | ORAL | 1 refills | Status: DC

## 2022-05-09 MED ORDER — ACETAMINOPHEN 325 MG PO TABS: 650.0000 mg | ORAL_TABLET | ORAL | Status: DC | PRN

## 2022-05-09 MED ORDER — INSULIN DETEMIR 100 UNIT/ML ~~LOC~~ SOLN
30.0000 [IU] | Freq: Every day | SUBCUTANEOUS | Status: DC
  Filled 2022-05-09: qty 0.3

## 2022-05-09 MED ORDER — ASPIRIN 81 MG PO TBEC
81.0000 mg | DELAYED_RELEASE_TABLET | Freq: Every day | ORAL | Status: DC
  Administered 2022-05-09: 81 mg via ORAL
  Filled 2022-05-09: qty 1

## 2022-05-09 MED ORDER — CALCIUM CARBONATE ANTACID 500 MG PO CHEW: 2.0000 | CHEWABLE_TABLET | ORAL | Status: DC | PRN

## 2022-05-09 MED ORDER — NIFEDIPINE ER OSMOTIC RELEASE 60 MG PO TB24
60.0000 mg | ORAL_TABLET | Freq: Two times a day (BID) | ORAL | Status: DC
  Administered 2022-05-09: 60 mg via ORAL
  Filled 2022-05-09: qty 1

## 2022-05-09 NOTE — H&P (Addendum)
FACULTY PRACTICE ANTEPARTUM ADMISSION HISTORY AND PHYSICAL NOTE   History of Present Illness: Tammie Thomas is a 32 y.o. G1P0 at [redacted]w[redacted]d admitted for blood pressure monitoring due to chronic HTN with severe range pressure.  Mostly BP 140-150/100s  Pt asymptomatic- no headache, no change in vision, no RUQ pain.  Notes LE edema for the past 2 weeks that typically improves each evening.    Pt initially presented due to decreased fetal movement, which has improved since arrival.   Patient reports uterine contraction  activity as none. Patient reports  vaginal bleeding as none. Patient describes fluid per vagina as None. Fetal presentation is cephalic.  Patient Active Problem List   Diagnosis Date Noted   Chronic hypertension affecting pregnancy 05/09/2022   Type 2 diabetes mellitus without complication, without long-term current use of insulin ([place]) 04/17/2022   Hypertension 04/17/2022   Anxiety 01/30/2022   Maternal morbid obesity, antepartum ([place]) 01/02/2022   Supervision of high risk pregnancy, antepartum 11/28/2021   BMI 50.0-59.9, adult ([place]) 11/28/2021   History of hypertension 11/28/2021   Indigestion 03/04/2018   Cyst of pancreas 03/04/2018    Past Medical History:  Diagnosis Date   Anxiety    Asthma    Cyst of pancreas 03/04/2018   GERD (gastroesophageal reflux disease)    Gestational diabetes    HLD (hyperlipidemia)    Hypertension    Obesity     Past Surgical History:  Procedure Laterality Date   NO PAST SURGERIES      OB History  Gravida Para Term Preterm AB Living  1            SAB IAB Ectopic Multiple Live Births               # Outcome Date GA Lbr Len/2nd Weight Sex Delivery Anes PTL Lv  1 Current             Social History   Socioeconomic History   Marital status: Single    Spouse name: Not on file   Number of children: 0   Years of education: Not on file   Highest education level: Not on file  Occupational History   Not on file  Tobacco Use    Smoking status: Former    Types: Cigarettes    Quit date: 02/25/2008    Years since quitting: 14.2   Smokeless tobacco: Never  Vaping Use   Vaping Use: Never used  Substance and Sexual Activity   Alcohol use: Not Currently   Drug use: No   Sexual activity: Not Currently  Other Topics Concern   Not on file  Social History Narrative   Not on file   Social Determinants of Health   Financial Resource Strain: Not on file  Food Insecurity: Not on file  Transportation Needs: Not on file  Physical Activity: Not on file  Stress: Not on file  Social Connections: Not on file    Family History  Problem Relation Age of Onset   Heart disease Mother    Hypertension Mother    Diabetes Father    Hypertension Father     No Known Allergies  Medications Prior to Admission  Medication Sig Dispense Refill Last Dose   insulin detemir (LEVEMIR FLEXTOUCH) 100 UNIT/ML FlexPen Inject 30 Units into the skin at bedtime. 15 mL 11 05/07/2022   NIFEdipine (PROCARDIA-XL/NIFEDICAL-XL) 30 MG 24 hr tablet Take 1 tablet (30 mg total) by mouth in the morning and at bedtime. 60 tablet 3 05/08/2022  Prenatal Vit-Fe Fumarate-FA (PRENATAL VITAMINS PO) Take by mouth.   05/07/2022   Accu-Chek Softclix Lancets lancets 1 each by Other route 4 (four) times daily. 100 each 12    aspirin EC 81 MG tablet Take 1 tablet (81 mg total) by mouth daily. Take after 12 weeks for prevention of preeclampsia later in pregnancy (Patient not taking: Reported on 02/27/2022) 300 tablet 2    blood glucose meter kit and supplies KIT Dispense based on patient and insurance preference. Use up to four times daily as directed. 1 each 0    citalopram (CELEXA) 10 MG tablet Take 1 tablet (10 mg total) by mouth daily. (Patient not taking: Reported on 03/31/2022) 30 tablet 3    glucose blood test strip Use as instructed 100 each 12    Insulin Pen Needle 33G X 5 MM MISC 1 Units by Does not apply route 5 (five) times daily. 100 each 3     Review  of Systems - General ROS: negative for - chills, fatigue, or fever Psychological ROS: negative Hematological and Lymphatic ROS: negative Respiratory ROS: no cough, shortness of breath, or wheezing Cardiovascular ROS: no chest pain or dyspnea on exertion Gastrointestinal ROS: notes acid reflux Genito-Urinary ROS: denies urinary concerns.  Denies irregular discharge Musculoskeletal ROS: edema Neurological ROS: no TIA or stroke symptoms Dermatological ROS: negative  Vitals:  BP (!) 167/108   Pulse (!) 103   Temp 98.1 F (36.7 C)   Resp 20   Ht 5\' 7"  (1.702 m)   Wt (!) 171.5 kg   LMP 09/07/2021   SpO2 100%   BMI 59.20 kg/m  Physical Examination: CONSTITUTIONAL: Well-developed, well-nourished female in no acute distress.  HENT:  Normocephalic, atraumatic EYES: Conjunctivae and EOM are normal. Pupils are equal, round, and reactive to light. No scleral icterus.  NECK: Normal range of motion, supple, no masses SKIN: Skin is warm and dry. No rash noted. Not diaphoretic. No erythema. No pallor. [place]: Alert and oriented to person, place, and time.  No cranial nerve deficit noted.   PSYCHIATRIC: Normal mood and affect. Normal behavior. Normal judgment and thought content. CARDIOVASCULAR: Normal heart rate noted, regular rhythm RESPIRATORY: Effort and breath sounds normal, no problems with respiration noted ABDOMEN: obese, gravid and soft MUSCULOSKELETAL: 2+ edema bilaterally  Cervical exam- deferred Completed in MAU- pt closed  Toco: q3-45min- pt not feeling contractions FHT: 140, moderate variability, +accels- 15x15 x2, no decels- Reactive  Labs:  Results for orders placed or performed during the hospital encounter of 05/08/22 (from the past 24 hour(s))  Urinalysis, Routine w reflex microscopic Urine, Clean Catch   Collection Time: 05/08/22 10:19 PM  Result Value Ref Range   Color, Urine YELLOW YELLOW   APPearance CLEAR CLEAR   Specific Gravity, Urine 1.009 1.005 - 1.030    pH 7.0 5.0 - 8.0   Glucose, UA NEGATIVE NEGATIVE mg/dL   Hgb urine dipstick SMALL (A) NEGATIVE   Bilirubin Urine NEGATIVE NEGATIVE   Ketones, ur NEGATIVE NEGATIVE mg/dL   Protein, ur 30 (A) NEGATIVE mg/dL   Nitrite NEGATIVE NEGATIVE   Leukocytes,Ua NEGATIVE NEGATIVE   RBC / HPF 0-5 0 - 5 RBC/hpf   WBC, UA 0-5 0 - 5 WBC/hpf   Bacteria, UA NONE SEEN NONE SEEN   Squamous Epithelial / HPF 0-5 0 - 5 /HPF  Protein / creatinine ratio, urine   Collection Time: 05/08/22 10:19 PM  Result Value Ref Range   Creatinine, Urine 121 mg/dL   Total Protein, Urine 27  mg/dL   Protein Creatinine Ratio 0.22 (H) 0.00 - 0.15 mg/mg[Cre]  Comprehensive metabolic panel   Collection Time: 05/08/22 10:41 PM  Result Value Ref Range   Sodium 133 (L) 135 - 145 mmol/L   Potassium 4.0 3.5 - 5.1 mmol/L   Chloride 105 98 - 111 mmol/L   CO2 18 (L) 22 - 32 mmol/L   Glucose, Bld 117 (H) 70 - 99 mg/dL   BUN 9 6 - 20 mg/dL   Creatinine, Ser 0.77 0.44 - 1.00 mg/dL   Calcium 9.6 8.9 - 10.3 mg/dL   Total Protein 6.4 (L) 6.5 - 8.1 g/dL   Albumin 2.6 (L) 3.5 - 5.0 g/dL   AST 22 15 - 41 U/L   ALT 25 0 - 44 U/L   Alkaline Phosphatase 130 (H) 38 - 126 U/L   Total Bilirubin <0.1 (L) 0.3 - 1.2 mg/dL   GFR, Estimated >60 >60 mL/min   Anion gap 10 5 - 15  CBC   Collection Time: 05/08/22 10:41 PM  Result Value Ref Range   WBC 8.3 4.0 - 10.5 K/uL   RBC 4.08 3.87 - 5.11 MIL/uL   Hemoglobin 11.2 (L) 12.0 - 15.0 g/dL   HCT 32.6 (L) 36.0 - 46.0 %   MCV 79.9 (L) 80.0 - 100.0 fL   MCH 27.5 26.0 - 34.0 pg   MCHC 34.4 30.0 - 36.0 g/dL   RDW 13.5 11.5 - 15.5 %   Platelets 337 150 - 400 K/uL   nRBC 0.0 0.0 - 0.2 %    Imaging Studies: [place] MFM FETAL BPP WO NON STRESS  Result Date: 05/02/2022 ----------------------------------------------------------------------  OBSTETRICS REPORT                       (Signed Final 05/02/2022 04:40 pm) ---------------------------------------------------------------------- Patient Info  ID #:        [id number]                          D.O.B.:  [birth year] (31 yrs)  Name:       [name]                    Visit Date: 05/02/2022 03:43 pm ---------------------------------------------------------------------- Performed By  Attending:        Valeda Malm DO       Ref. Address:     [address]                                                             [place], [place]  Performed By:     Nevin Bloodgood          [facility]  Referred By:      Levie Heritage                    MD ---------------------------------------------------------------------- Orders  #  Description                           Code        Ordered By  1  [place] MFM FETAL BPP WO NON               76819.01    YU FANG     STRESS  2  [place] MFM OB FOLLOW UP                   76816.01    YU FANG ----------------------------------------------------------------------  #  Order #                     Accession #                Episode #  1  [id number]                   [id number]                 [id number]  2  [id number]                   [id number]                 [id number] ---------------------------------------------------------------------- Indications  Obesity complicating pregnancy, third          O99.213  trimester  Hypertension - Chronic/Pre-existing            O10.019  (Procardia)  Echogenic intracardiac focus of the heart      O35.8XX0  (EIF)  Diabetes - Pregestational,3rd trimester        O24.313  (Levemir)  [redacted] weeks gestation of pregnancy                Z3A.33 ---------------------------------------------------------------------- Fetal Evaluation  Num Of Fetuses:         1  Fetal Heart Rate(bpm):  144  Cardiac Activity:       Observed  Presentation:            Cephalic  Placenta:               Anterior  P. Cord Insertion:      Previously visualized  Amniotic Fluid  AFI FV:      Within normal limits  AFI Sum(cm)     %Tile       Largest Pocket(cm)  21.46           81  7.55  RUQ(cm)       RLQ(cm)       LUQ(cm)        LLQ(cm)  4.28          5.87          3.76           7.55 ---------------------------------------------------------------------- Biophysical Evaluation  Amniotic F.V:   Pocket => 2 cm             F. Tone:        Observed  F. Movement:    Observed                   Score:          8/8  F. Breathing:   Observed ---------------------------------------------------------------------- Biometry  BPD:      86.8  mm     G. Age:  35w 0d         79  %    CI:        75.41   %    70 - 86                                                          FL/HC:      19.9   %    19.4 - 21.8  HC:       317   mm     G. Age:  35w 4d         60  %    HC/AC:      0.97        0.96 - 1.11  AC:      325.4  mm     G. Age:  36w 3d         98  %    FL/BPD:     72.8   %    71 - 87  FL:       63.2  mm     G. Age:  32w 5d         14  %    FL/AC:      19.4   %    20 - 24  Est. FW:    2632  gm    5 lb 13 oz      83  % ---------------------------------------------------------------------- OB History  Gravidity:    1 ---------------------------------------------------------------------- Gestational Age  LMP:           33w 6d        Date:  09/07/21                  EDD:   06/14/22  U/S Today:     35w 0d                                        EDD:   06/06/22  Best:          33w 6d     Det. By:  LMP  (09/07/21)          EDD:   06/14/22 ---------------------------------------------------------------------- Anatomy  Cranium:               Appears normal  LVOT:                   Previously seen  Cavum:                 Previously             Aortic Arch:            Previously seen                         visualized  Ventricles:            Previously seen        Ductal Arch:            Previously  seen  Choroid Plexus:        Previously seen        Diaphragm:              Previously seen  Cerebellum:            Previously seen        Stomach:                Appears normal, left                                                                        sided  Posterior Fossa:       Previously seen        Abdomen:                Previously seen  Nuchal Fold:           Not applicable (>20    Abdominal Wall:         Previously seen                         wks GA)  Face:                  Orbits and profile     Cord Vessels:           Previously seen                         previously seen  Lips:                  Previously seen        Kidneys:                Previously seen  Palate:                Not well visualized    Bladder:                Appears normal  Thoracic:              Previously seen        Spine:                  Previously seen  Heart:                 Previously seen, EIF   Upper Extremities:      Prev Visualized  RVOT:                  Previously seen        Lower Extremities:      Prev Visualized  Other:  Female gender previously seen. Hands and feet not well visualized.          Technically difficult due to maternal habitus and fetal position. ---------------------------------------------------------------------- Cervix Uterus Adnexa  Cervix  Not visualized (advanced GA >24wks)  Uterus  No abnormality visualized.  Right Ovary  Within normal limits.  Left Ovary  Not visualized.  Cul De Sac  No free fluid seen.  Adnexa  No abnormality visualized ---------------------------------------------------------------------- Comments  The patient is here for a follow-up BPP and growth ultrasound  at 33w 6d for CHTN (Procardia) and DM2 (insulin). EDD:  06/14/2022 dated by LMP  (09/07/21). She has no concerns  today and reports her blood sugars are improving. BP is  144/97 without s/s of preE.  Sonographic findings  Single intrauterine pregnancy.  Fetal cardiac activity: Observed.  Presentation: Cephalic.   Interval fetal anatomy appears normal  Fetal biometry shows the estimated fetal weight at the 83  percentile.  Amniotic fluid volume: Within normal limits. AFI: 21.46 cm.  MVP: 7.55 cm.  Placenta: Anterior.  BPP: 8/8.  Recommendations  1. BBPs weekly until delivery  2. Growth ultrasounds every 4 weeks until delivery  3. Delivery around 37-[redacted] weeks gestation or sooner if  indicated. ----------------------------------------------------------------------                  Valeda Malm, DO Electronically Signed Final Report   05/02/2022 04:40 pm ----------------------------------------------------------------------   Assessment and Plan: Patient Active Problem List   Diagnosis Date Noted   Chronic hypertension affecting pregnancy 05/09/2022   Type 2 diabetes mellitus without complication, without long-term current use of insulin ([place]) 04/17/2022   Hypertension 04/17/2022   Anxiety 01/30/2022   Maternal morbid obesity, antepartum ([place]) 01/02/2022   Supervision of high risk pregnancy, antepartum 11/28/2021   BMI 50.0-59.9, adult ([place]) 11/28/2021   History of hypertension 11/28/2021   Indigestion 03/04/2018   Cyst of pancreas 03/04/2018   Admit to Antenatal observation 1) Chronic HTN Increased to Procardia XL 60mg  bid IV Labetalol protocol in place No evidence of preeclampsia symptoms, will continue to closely monitor  2) Type 2 DM -continue home Levemir -accuchecks fasting and 2hr PP  3) Fetal well being Reactive NST, continue q shift monitoring Plan for BPP later today  4) Maternal care -PNV and ASA daily -DVT prophylaxis: encouraged ambulation as tolerated, SCDs while in bed  Janyth Pupa, DO Attending [place], [place] for [employer], [place], DO Attending [place], [place] for [place], [place]

## 2022-05-09 NOTE — Discharge Summary (Addendum)
Patient ID: Tammie Thomas MRN: [id number] DOB/AGE: 04/24/1990 32 y.o.  Admit date: 05/08/2022 Discharge date: 05/09/2022  Admission Diagnoses:Supervision of high risk pregnancy, antepartum  Chronic hypertension affecting pregnancy - Plan: Discharge patient  Type 2 diabetes mellitus without complication, without long-term current use of insulin (HCC) - Plan: Discharge patient  Maternal morbid obesity, antepartum (HCC) - Plan: Discharge patient   Discharge Diagnoses:   Prenatal Procedures: NST and BPP  Consults: Neonatology, Maternal Fetal Medicine  Hospital Course:  This is a 32 y.o. G1P0 with IUP at [redacted]w[redacted]d admitted for evaluation of elevated BP, admitted for blood pressure monitoring due to chronic HTN with severe range pressure.  Mostly BP 140-150/100s  She was admitted no headache or visual changes,no leaking of fluid and no bleeding.  She has CHTN on Procardia and Type 2 DM on Levemir. Her BP improved with adjustment of her Procardia dose and her labs were normal. BPP 8/8 BG values were stable  She was deemed stable for discharge to home with outpatient follow up.  Discharge Exam: Temp:  [98.1 F (36.7 C)-98.5 F (36.9 C)] 98.2 F (36.8 C) (01/19 1613) Pulse Rate:  [87-104] 91 (01/19 1613) Resp:  [18-20] 18 (01/19 1613) BP: (111-167)/(55-108) 133/75 (01/19 1613) SpO2:  [98 %-100 %] 98 % (01/19 1247) Weight:  [171.5 kg] 171.5 kg (01/18 2149) Physical Examination: CONSTITUTIONAL: Well-developed, well-nourished female in no acute distress.  HENT:  Normocephalic, atraumatic, External right and left ear normal. Oropharynx is clear and moist EYES: Conjunctivae and EOM are normal. Pupils are equal, round, and reactive to light. No scleral icterus.  NECK: Normal range of motion, supple, no masses SKIN: Skin is warm and dry. No rash noted. Not diaphoretic. No erythema. No pallor. NEUROLGIC: Alert and oriented to person, place, and time. Normal reflexes, muscle tone coordination. No  cranial nerve deficit noted. PSYCHIATRIC: Normal mood and affect. Normal behavior. Normal judgment and thought content. CARDIOVASCULAR: Normal heart rate noted, regular rhythm RESPIRATORY: Effort and breath sounds normal, no problems with respiration noted MUSCULOSKELETAL: Normal range of motion. No edema and no tenderness. 2+ distal pulses. ABDOMEN: Soft, nontender, nondistended, gravid. CERVIX: Dilation: Closed Exam by:: Thressa Sheller, CNM  Fetal monitoring: FHR: 145 bpm, Variability: moderate, Accelerations: Present, Decelerations: Absent  Uterine activity: none contractions per hour  Significant Diagnostic Studies:  Results for orders placed or performed during the hospital encounter of 05/08/22 (from the past 168 hour(s))  Urinalysis, Routine w reflex microscopic Urine, Clean Catch   Collection Time: 05/08/22 10:19 PM  Result Value Ref Range   Color, Urine YELLOW YELLOW   APPearance CLEAR CLEAR   Specific Gravity, Urine 1.009 1.005 - 1.030   pH 7.0 5.0 - 8.0   Glucose, UA NEGATIVE NEGATIVE mg/dL   Hgb urine dipstick SMALL (A) NEGATIVE   Bilirubin Urine NEGATIVE NEGATIVE   Ketones, ur NEGATIVE NEGATIVE mg/dL   Protein, ur 30 (A) NEGATIVE mg/dL   Nitrite NEGATIVE NEGATIVE   Leukocytes,Ua NEGATIVE NEGATIVE   RBC / HPF 0-5 0 - 5 RBC/hpf   WBC, UA 0-5 0 - 5 WBC/hpf   Bacteria, UA NONE SEEN NONE SEEN   Squamous Epithelial / HPF 0-5 0 - 5 /HPF  Protein / creatinine ratio, urine   Collection Time: 05/08/22 10:19 PM  Result Value Ref Range   Creatinine, Urine 121 mg/dL   Total Protein, Urine 27 mg/dL   Protein Creatinine Ratio 0.22 (H) 0.00 - 0.15 mg/mg[Cre]  Comprehensive metabolic panel   Collection Time: 05/08/22 10:41 PM  Result  Value Ref Range   Sodium 133 (L) 135 - 145 mmol/L   Potassium 4.0 3.5 - 5.1 mmol/L   Chloride 105 98 - 111 mmol/L   CO2 18 (L) 22 - 32 mmol/L   Glucose, Bld 117 (H) 70 - 99 mg/dL   BUN 9 6 - 20 mg/dL   Creatinine, Ser 0.77 0.44 - 1.00 mg/dL    Calcium 9.6 8.9 - 10.3 mg/dL   Total Protein 6.4 (L) 6.5 - 8.1 g/dL   Albumin 2.6 (L) 3.5 - 5.0 g/dL   AST 22 15 - 41 U/L   ALT 25 0 - 44 U/L   Alkaline Phosphatase 130 (H) 38 - 126 U/L   Total Bilirubin <0.1 (L) 0.3 - 1.2 mg/dL   GFR, Estimated >60 >60 mL/min   Anion gap 10 5 - 15  CBC   Collection Time: 05/08/22 10:41 PM  Result Value Ref Range   WBC 8.3 4.0 - 10.5 K/uL   RBC 4.08 3.87 - 5.11 MIL/uL   Hemoglobin 11.2 (L) 12.0 - 15.0 g/dL   HCT 32.6 (L) 36.0 - 46.0 %   MCV 79.9 (L) 80.0 - 100.0 fL   MCH 27.5 26.0 - 34.0 pg   MCHC 34.4 30.0 - 36.0 g/dL   RDW 13.5 11.5 - 15.5 %   Platelets 337 150 - 400 K/uL   nRBC 0.0 0.0 - 0.2 %  Type and screen [place] If possible add to previously drawn blood   Collection Time: 05/09/22 12:45 AM  Result Value Ref Range   ABO/RH(D) A POS    Antibody Screen NEG    Sample Expiration      05/12/2022,2359 Performed at [facility], [address], [place], [place] [address]   Glucose, capillary   Collection Time: 05/09/22  9:32 AM  Result Value Ref Range   Glucose-Capillary 109 (H) 70 - 99 mg/dL  Glucose, capillary   Collection Time: 05/09/22 11:47 AM  Result Value Ref Range   Glucose-Capillary 128 (H) 70 - 99 mg/dL  Glucose, capillary   Collection Time: 05/09/22  2:19 PM  Result Value Ref Range   Glucose-Capillary 141 (H) 70 - 99 mg/dL    Discharge Condition: Stable  Disposition: Discharge disposition: 01-Home or Self Care        Discharge Instructions     Discharge patient   Complete by: As directed    Discharge disposition: 01-Home or Self Care   Discharge patient date: 05/09/2022      Allergies as of 05/09/2022   No Known Allergies      Medication List     TAKE these medications    Accu-Chek Softclix Lancets lancets 1 each by Other route 4 (four) times daily.   aspirin EC 81 MG tablet Take 1 tablet (81 mg total) by mouth daily. Take after 12 weeks for prevention of preeclampsia  later in pregnancy   blood glucose meter kit and supplies Kit Dispense based on patient and insurance preference. Use up to four times daily as directed.   citalopram 10 MG tablet Commonly known as: CeleXA Take 1 tablet (10 mg total) by mouth daily.   glucose blood test strip Use as instructed   Insulin Pen Needle 33G X 5 MM Misc 1 Units by Does not apply route 5 (five) times daily.   Levemir FlexTouch 100 UNIT/ML FlexPen Generic drug: insulin detemir Inject 30 Units into the skin at bedtime.   NIFEdipine 60 MG 24 hr tablet Commonly  known as: ADALAT CC Take 1 tablet (60 mg total) by mouth 2 (two) times daily. What changed:  medication strength how much to take when to take this   pantoprazole 20 MG tablet Commonly known as: Protonix Take 1 tablet (20 mg total) by mouth daily.   PRENATAL VITAMINS PO Take by mouth.        Follow-up [place] Follow up on 05/15/2022.   Specialty: Obstetrics and Gynecology Contact information: [address] [place] [address] [phone]                Signed: Emeterio Reeve M.D. 05/09/2022, 6:46 PM

## 2022-05-15 ENCOUNTER — Encounter: Payer: Commercial Managed Care - HMO | Admitting: Family Medicine

## 2022-05-16 ENCOUNTER — Ambulatory Visit: Payer: Medicaid Other | Admitting: *Deleted

## 2022-05-16 ENCOUNTER — Other Ambulatory Visit: Payer: Self-pay | Admitting: Obstetrics

## 2022-05-16 ENCOUNTER — Other Ambulatory Visit (HOSPITAL_COMMUNITY): Payer: Self-pay | Admitting: Advanced Practice Midwife

## 2022-05-16 ENCOUNTER — Ambulatory Visit (HOSPITAL_BASED_OUTPATIENT_CLINIC_OR_DEPARTMENT_OTHER): Payer: Medicaid Other

## 2022-05-16 VITALS — BP 148/103 | HR 118

## 2022-05-16 DIAGNOSIS — O35BXX0 Maternal care for other (suspected) fetal abnormality and damage, fetal cardiac anomalies, not applicable or unspecified: Secondary | ICD-10-CM | POA: Diagnosis not present

## 2022-05-16 DIAGNOSIS — O10013 Pre-existing essential hypertension complicating pregnancy, third trimester: Secondary | ICD-10-CM | POA: Diagnosis not present

## 2022-05-16 DIAGNOSIS — O0990 Supervision of high risk pregnancy, unspecified, unspecified trimester: Secondary | ICD-10-CM

## 2022-05-16 DIAGNOSIS — O114 Pre-existing hypertension with pre-eclampsia, complicating childbirth: Secondary | ICD-10-CM | POA: Diagnosis not present

## 2022-05-16 DIAGNOSIS — O99213 Obesity complicating pregnancy, third trimester: Secondary | ICD-10-CM

## 2022-05-16 DIAGNOSIS — Z3A35 35 weeks gestation of pregnancy: Secondary | ICD-10-CM

## 2022-05-16 DIAGNOSIS — E669 Obesity, unspecified: Secondary | ICD-10-CM

## 2022-05-16 DIAGNOSIS — R030 Elevated blood-pressure reading, without diagnosis of hypertension: Secondary | ICD-10-CM | POA: Diagnosis not present

## 2022-05-16 DIAGNOSIS — O10913 Unspecified pre-existing hypertension complicating pregnancy, third trimester: Secondary | ICD-10-CM

## 2022-05-16 DIAGNOSIS — Z794 Long term (current) use of insulin: Secondary | ICD-10-CM

## 2022-05-16 DIAGNOSIS — O283 Abnormal ultrasonic finding on antenatal screening of mother: Secondary | ICD-10-CM

## 2022-05-16 DIAGNOSIS — E119 Type 2 diabetes mellitus without complications: Secondary | ICD-10-CM

## 2022-05-16 DIAGNOSIS — O24113 Pre-existing diabetes mellitus, type 2, in pregnancy, third trimester: Secondary | ICD-10-CM | POA: Diagnosis not present

## 2022-05-16 NOTE — Procedures (Signed)
Tammie Thomas Nov 13, 1990 [redacted]w[redacted]d  Fetus A Non-Stress Test Interpretation for 05/16/22  Indication: Unsatisfactory BPP  Fetal Heart Rate A Mode: External Baseline Rate (A): 140 bpm Variability: Moderate Accelerations: 15 x 15 Decelerations: None Multiple birth?: No  Uterine Activity Mode: Palpation, Toco Contraction Frequency (min): 4-6 with ui Contraction Duration (sec): 50-60 Contraction Quality: Mild Resting Tone Palpated: Relaxed Resting Time: Adequate  Interpretation (Fetal Testing) Nonstress Test Interpretation: Reactive Overall Impression: Reassuring for gestational age Comments: Dr. Annamaria Boots reviewed tracing

## 2022-05-17 ENCOUNTER — Inpatient Hospital Stay (HOSPITAL_COMMUNITY)
Admission: AD | Admit: 2022-05-17 | Discharge: 2022-05-22 | DRG: 787 | Disposition: A | Discharge status: Home / Self Care | Payer: Medicaid Other | Attending: Obstetrics and Gynecology | Admitting: Obstetrics and Gynecology

## 2022-05-17 ENCOUNTER — Encounter (HOSPITAL_COMMUNITY): Payer: Self-pay | Admitting: Obstetrics & Gynecology

## 2022-05-17 DIAGNOSIS — E119 Type 2 diabetes mellitus without complications: Secondary | ICD-10-CM | POA: Diagnosis present

## 2022-05-17 DIAGNOSIS — O24424 Gestational diabetes mellitus in childbirth, insulin controlled: Secondary | ICD-10-CM | POA: Diagnosis not present

## 2022-05-17 DIAGNOSIS — O1410 Severe pre-eclampsia, unspecified trimester: Principal | ICD-10-CM

## 2022-05-17 DIAGNOSIS — O99214 Obesity complicating childbirth: Secondary | ICD-10-CM | POA: Diagnosis present

## 2022-05-17 DIAGNOSIS — Z3A36 36 weeks gestation of pregnancy: Secondary | ICD-10-CM | POA: Diagnosis not present

## 2022-05-17 DIAGNOSIS — O24425 Gestational diabetes mellitus in childbirth, controlled by oral hypoglycemic drugs: Secondary | ICD-10-CM | POA: Diagnosis not present

## 2022-05-17 DIAGNOSIS — Z87891 Personal history of nicotine dependence: Secondary | ICD-10-CM

## 2022-05-17 DIAGNOSIS — O1002 Pre-existing essential hypertension complicating childbirth: Secondary | ICD-10-CM | POA: Diagnosis present

## 2022-05-17 DIAGNOSIS — D62 Acute posthemorrhagic anemia: Secondary | ICD-10-CM | POA: Diagnosis not present

## 2022-05-17 DIAGNOSIS — O9902 Anemia complicating childbirth: Secondary | ICD-10-CM | POA: Diagnosis present

## 2022-05-17 DIAGNOSIS — O1414 Severe pre-eclampsia complicating childbirth: Secondary | ICD-10-CM | POA: Diagnosis not present

## 2022-05-17 DIAGNOSIS — Z98891 History of uterine scar from previous surgery: Secondary | ICD-10-CM

## 2022-05-17 DIAGNOSIS — O1413 Severe pre-eclampsia, third trimester: Secondary | ICD-10-CM | POA: Diagnosis present

## 2022-05-17 DIAGNOSIS — Z79899 Other long term (current) drug therapy: Secondary | ICD-10-CM

## 2022-05-17 DIAGNOSIS — Z794 Long term (current) use of insulin: Secondary | ICD-10-CM

## 2022-05-17 DIAGNOSIS — Z6841 Body Mass Index (BMI) 40.0 and over, adult: Secondary | ICD-10-CM

## 2022-05-17 DIAGNOSIS — O2412 Pre-existing diabetes mellitus, type 2, in childbirth: Secondary | ICD-10-CM | POA: Diagnosis present

## 2022-05-17 DIAGNOSIS — O10919 Unspecified pre-existing hypertension complicating pregnancy, unspecified trimester: Secondary | ICD-10-CM | POA: Diagnosis present

## 2022-05-17 DIAGNOSIS — O99210 Obesity complicating pregnancy, unspecified trimester: Secondary | ICD-10-CM | POA: Diagnosis present

## 2022-05-17 DIAGNOSIS — O114 Pre-existing hypertension with pre-eclampsia, complicating childbirth: Principal | ICD-10-CM | POA: Diagnosis present

## 2022-05-17 DIAGNOSIS — R030 Elevated blood-pressure reading, without diagnosis of hypertension: Secondary | ICD-10-CM | POA: Diagnosis not present

## 2022-05-17 DIAGNOSIS — O0990 Supervision of high risk pregnancy, unspecified, unspecified trimester: Secondary | ICD-10-CM

## 2022-05-17 LAB — URINALYSIS, ROUTINE W REFLEX MICROSCOPIC
Bacteria, UA: NONE SEEN
Bilirubin Urine: NEGATIVE
Glucose, UA: NEGATIVE mg/dL
Hgb urine dipstick: NEGATIVE
Ketones, ur: NEGATIVE mg/dL
Leukocytes,Ua: NEGATIVE
Nitrite: NEGATIVE
Protein, ur: 100 mg/dL — AB
Specific Gravity, Urine: 1.014 (ref 1.005–1.030)
pH: 6 (ref 5.0–8.0)

## 2022-05-17 LAB — PROTEIN / CREATININE RATIO, URINE
Creatinine, Urine: 155 mg/dL
Protein Creatinine Ratio: 0.75 mg/mg{Cre} — ABNORMAL HIGH (ref 0.00–0.15)
Total Protein, Urine: 117 mg/dL

## 2022-05-17 LAB — CBC
HCT: 35.3 % — ABNORMAL LOW (ref 36.0–46.0)
Hemoglobin: 11.7 g/dL — ABNORMAL LOW (ref 12.0–15.0)
MCH: 27.2 pg (ref 26.0–34.0)
MCHC: 33.1 g/dL (ref 30.0–36.0)
MCV: 82.1 fL (ref 80.0–100.0)
Platelets: 387 10*3/uL (ref 150–400)
RBC: 4.3 MIL/uL (ref 3.87–5.11)
RDW: 13.8 % (ref 11.5–15.5)
WBC: 7.7 10*3/uL (ref 4.0–10.5)
nRBC: 0 % (ref 0.0–0.2)

## 2022-05-17 LAB — COMPREHENSIVE METABOLIC PANEL
ALT: 23 U/L (ref 0–44)
AST: 29 U/L (ref 15–41)
Albumin: 2.7 g/dL — ABNORMAL LOW (ref 3.5–5.0)
Alkaline Phosphatase: 158 U/L — ABNORMAL HIGH (ref 38–126)
Anion gap: 11 (ref 5–15)
BUN: 9 mg/dL (ref 6–20)
CO2: 18 mmol/L — ABNORMAL LOW (ref 22–32)
Calcium: 9 mg/dL (ref 8.9–10.3)
Chloride: 105 mmol/L (ref 98–111)
Creatinine, Ser: 0.81 mg/dL (ref 0.44–1.00)
GFR, Estimated: >60 mL/min (ref >60)
Glucose, Bld: 93 mg/dL (ref 70–99)
Potassium: 4.1 mmol/L (ref 3.5–5.1)
Sodium: 134 mmol/L — ABNORMAL LOW (ref 135–145)
Total Bilirubin: 0.4 mg/dL (ref 0.3–1.2)
Total Protein: 6.4 g/dL — ABNORMAL LOW (ref 6.5–8.1)

## 2022-05-17 LAB — GLUCOSE, CAPILLARY: Glucose-Capillary: 109 mg/dL — ABNORMAL HIGH (ref 70–99)

## 2022-05-17 LAB — TYPE AND SCREEN
ABO/RH(D): A POS
Antibody Screen: NEGATIVE

## 2022-05-17 MED ORDER — ONDANSETRON HCL 4 MG/2ML IJ SOLN
4.0000 mg | Freq: Four times a day (QID) | INTRAMUSCULAR | Status: DC | PRN
  Administered 2022-05-18 – 2022-05-19 (×2): 4 mg via INTRAVENOUS
  Filled 2022-05-17 (×3): qty 2

## 2022-05-17 MED ORDER — MISOPROSTOL 25 MCG QUARTER TABLET
25.0000 ug | ORAL_TABLET | Freq: Once | ORAL | Status: AC
  Administered 2022-05-17: 25 ug via VAGINAL
  Filled 2022-05-17: qty 1

## 2022-05-17 MED ORDER — OXYTOCIN-SODIUM CHLORIDE 30-0.9 UT/500ML-% IV SOLN
1.0000 m[IU]/min | INTRAVENOUS | Status: DC
  Administered 2022-05-18: 2 m[IU]/min via INTRAVENOUS
  Administered 2022-05-19: 32 m[IU]/min via INTRAVENOUS
  Filled 2022-05-17 (×2): qty 500

## 2022-05-17 MED ORDER — INSULIN ASPART 100 UNIT/ML IJ SOLN
0.0000 [IU] | INTRAMUSCULAR | Status: DC
  Administered 2022-05-17: 1 [IU] via SUBCUTANEOUS
  Administered 2022-05-18: 2 [IU] via SUBCUTANEOUS
  Administered 2022-05-18: 1 [IU] via SUBCUTANEOUS
  Administered 2022-05-18: 2 [IU] via SUBCUTANEOUS
  Administered 2022-05-18 – 2022-05-19 (×3): 1 [IU] via SUBCUTANEOUS
  Administered 2022-05-19 (×2): 2 [IU] via SUBCUTANEOUS

## 2022-05-17 MED ORDER — LABETALOL HCL 5 MG/ML IV SOLN: 80.0000 mg | INTRAVENOUS | Status: DC | PRN

## 2022-05-17 MED ORDER — FLEET ENEMA 7-19 GM/118ML RE ENEM: 1.0000 | ENEMA | RECTAL | Status: DC | PRN

## 2022-05-17 MED ORDER — LACTATED RINGERS IV SOLN: INTRAVENOUS | Status: DC

## 2022-05-17 MED ORDER — SOD CITRATE-CITRIC ACID 500-334 MG/5ML PO SOLN
30.0000 mL | ORAL | Status: DC | PRN
  Filled 2022-05-17: qty 30

## 2022-05-17 MED ORDER — LABETALOL HCL 5 MG/ML IV SOLN
20.0000 mg | INTRAVENOUS | Status: DC | PRN
  Administered 2022-05-17: 20 mg via INTRAVENOUS
  Filled 2022-05-17: qty 4

## 2022-05-17 MED ORDER — INSULIN GLARGINE-YFGN 100 UNIT/ML ~~LOC~~ SOLN
15.0000 [IU] | Freq: Every day | SUBCUTANEOUS | Status: DC
  Administered 2022-05-17 – 2022-05-18 (×2): 15 [IU] via SUBCUTANEOUS
  Filled 2022-05-17 (×3): qty 0.15

## 2022-05-17 MED ORDER — LABETALOL HCL 5 MG/ML IV SOLN
40.0000 mg | INTRAVENOUS | Status: DC | PRN
  Administered 2022-05-17: 40 mg via INTRAVENOUS
  Filled 2022-05-17: qty 8

## 2022-05-17 MED ORDER — LIDOCAINE HCL (PF) 1 % IJ SOLN: 30.0000 mL | INTRAMUSCULAR | Status: DC | PRN

## 2022-05-17 MED ORDER — NIFEDIPINE ER OSMOTIC RELEASE 60 MG PO TB24
60.0000 mg | ORAL_TABLET | Freq: Two times a day (BID) | ORAL | Status: DC
  Administered 2022-05-17 – 2022-05-22 (×10): 60 mg via ORAL
  Filled 2022-05-17: qty 1
  Filled 2022-05-17 (×2): qty 2
  Filled 2022-05-17: qty 1
  Filled 2022-05-17: qty 2
  Filled 2022-05-17: qty 1
  Filled 2022-05-17 (×2): qty 2
  Filled 2022-05-17 (×2): qty 1
  Filled 2022-05-17: qty 2
  Filled 2022-05-17: qty 1

## 2022-05-17 MED ORDER — HYDRALAZINE HCL 20 MG/ML IJ SOLN: 10.0000 mg | INTRAMUSCULAR | Status: DC | PRN

## 2022-05-17 MED ORDER — NIFEDIPINE ER OSMOTIC RELEASE 30 MG PO TB24: 60.0000 mg | ORAL_TABLET | Freq: Once | ORAL | Status: DC

## 2022-05-17 MED ORDER — OXYCODONE-ACETAMINOPHEN 5-325 MG PO TABS: 2.0000 | ORAL_TABLET | ORAL | Status: DC | PRN

## 2022-05-17 MED ORDER — OXYTOCIN BOLUS FROM INFUSION: 333.0000 mL | Freq: Once | INTRAVENOUS | Status: DC

## 2022-05-17 MED ORDER — SODIUM CHLORIDE 0.9 % IV SOLN
5.0000 10*6.[IU] | Freq: Once | INTRAVENOUS | Status: AC
  Administered 2022-05-18: 5 10*6.[IU] via INTRAVENOUS
  Filled 2022-05-17: qty 5

## 2022-05-17 MED ORDER — OXYTOCIN-SODIUM CHLORIDE 30-0.9 UT/500ML-% IV SOLN: 2.5000 [IU]/h | INTRAVENOUS | Status: DC

## 2022-05-17 MED ORDER — MISOPROSTOL 50MCG HALF TABLET
50.0000 ug | ORAL_TABLET | ORAL | Status: DC | PRN
  Administered 2022-05-19: 800 ug via VAGINAL

## 2022-05-17 MED ORDER — MISOPROSTOL 50MCG HALF TABLET
50.0000 ug | ORAL_TABLET | Freq: Once | ORAL | Status: AC
  Administered 2022-05-17: 50 ug via ORAL
  Filled 2022-05-17: qty 1

## 2022-05-17 MED ORDER — LACTATED RINGERS IV SOLN: 500.0000 mL | INTRAVENOUS | Status: DC | PRN

## 2022-05-17 MED ORDER — PENICILLIN G POT IN DEXTROSE 60000 UNIT/ML IV SOLN
3.0000 10*6.[IU] | INTRAVENOUS | Status: DC
  Administered 2022-05-18 – 2022-05-19 (×7): 3 10*6.[IU] via INTRAVENOUS
  Filled 2022-05-17 (×6): qty 50

## 2022-05-17 MED ORDER — OXYCODONE-ACETAMINOPHEN 5-325 MG PO TABS: 1.0000 | ORAL_TABLET | ORAL | Status: DC | PRN

## 2022-05-17 MED ORDER — TERBUTALINE SULFATE 1 MG/ML IJ SOLN: 0.2500 mg | Freq: Once | INTRAMUSCULAR | Status: DC | PRN

## 2022-05-17 MED ORDER — ACETAMINOPHEN 325 MG PO TABS: 650.0000 mg | ORAL_TABLET | ORAL | Status: DC | PRN

## 2022-05-17 NOTE — MAU Provider Note (Signed)
History     CSN: [id number]  Arrival date and time: 05/17/22 1859   Event Date/Time   First Provider Initiated Contact with Patient 05/17/22 2022      Chief Complaint  Patient presents with   Hypertension   Tammie Thomas , a  32 y.o. G1P0 at [redacted]w[redacted]d presents to MAU with complaints of elevated BPs at home and feeling flushed that started today. Patient states she was in [employer] today and was feeling "flushed" she noticed new onset swelling in her feet and went. States she checked her BP at home and was severe range 140/115 + and 160/115 upon recheck. Patient denies headache, blurred vision or visual changes. Just notes sinus pressure in the center of her face and underneath her eyes. She also noted worsening shortness of breath, she states "outside of normal SOB that is associated with pregnancy." She denies light headed and dizziness. She denies epigastric pain vaginal bleeding, leaking of fluid. She endorses "non-painful" contractions and positive fetal movement. She states she takes Procardia 60mg  BID. She endorses taking her morning dose around 11am and states she has not taken her evening dose.      Patient recently admitted to [facility] for similar complaints.     OB History     Gravida  1   Para      Term      Preterm      AB      Living         SAB      IAB      Ectopic      Multiple      Live Births              Past Medical History:  Diagnosis Date   Anxiety    Asthma    Cyst of pancreas 03/04/2018   GERD (gastroesophageal reflux disease)    Gestational diabetes    HLD (hyperlipidemia)    Hypertension    Obesity     Past Surgical History:  Procedure Laterality Date   NO PAST SURGERIES      Family History  Problem Relation Age of Onset   Heart disease Mother    Hypertension Mother    Diabetes Father    Hypertension Father     Social History   Tobacco Use   Smoking status: Former    Types: Cigarettes    Quit date: 02/25/2008    Years  since quitting: 14.2   Smokeless tobacco: Never  Vaping Use   Vaping Use: Never used  Substance Use Topics   Alcohol use: Not Currently   Drug use: No    Allergies: No Known Allergies  Medications Prior to Admission  Medication Sig Dispense Refill Last Dose   insulin detemir (LEVEMIR FLEXTOUCH) 100 UNIT/ML FlexPen Inject 30 Units into the skin at bedtime. 15 mL 11 05/16/2022   Insulin Pen Needle 33G X 5 MM MISC 1 Units by Does not apply route 5 (five) times daily. 100 each 3 05/16/2022   NIFEdipine (ADALAT CC) 60 MG 24 hr tablet Take 1 tablet (60 mg total) by mouth 2 (two) times daily. 60 tablet 1 05/17/2022   pantoprazole (PROTONIX) 20 MG tablet Take 1 tablet (20 mg total) by mouth daily. 30 tablet 1 05/17/2022   Prenatal Vit-Fe Fumarate-FA (PRENATAL VITAMINS PO) Take by mouth.   05/16/2022   Accu-Chek Softclix Lancets lancets 1 each by Other route 4 (four) times daily. 100 each 12    aspirin EC 81  MG tablet Take 1 tablet (81 mg total) by mouth daily. Take after 12 weeks for prevention of preeclampsia later in pregnancy (Patient not taking: Reported on 02/27/2022) 300 tablet 2    blood glucose meter kit and supplies KIT Dispense based on patient and insurance preference. Use up to four times daily as directed. 1 each 0    citalopram (CELEXA) 10 MG tablet Take 1 tablet (10 mg total) by mouth daily. (Patient not taking: Reported on 03/31/2022) 30 tablet 3    glucose blood test strip Use as instructed 100 each 12     Review of Systems  Constitutional:  Negative for chills, fatigue and fever.  Eyes:  Negative for pain and visual disturbance.  Respiratory:  Negative for apnea, shortness of breath and wheezing.   Cardiovascular:  Negative for chest pain and palpitations.  Gastrointestinal:  Negative for abdominal pain, constipation, diarrhea, nausea and vomiting.  Genitourinary:  Negative for difficulty urinating, dysuria, pelvic pain, vaginal bleeding, vaginal discharge and vaginal pain.   Musculoskeletal:  Negative for back pain.  Neurological:  Negative for seizures, weakness, numbness and headaches.  Psychiatric/Behavioral:  Negative for suicidal ideas.    Physical Exam   Blood pressure (!) 144/113, pulse 100, temperature 98.2 F (36.8 C), temperature source Oral, resp. rate 20, height 5\' 7"  (1.702 m), weight (!) 171.6 kg, last menstrual period 09/07/2021, SpO2 100 %.  Physical Exam Vitals and nursing note reviewed.  Constitutional:      General: She is not in acute distress.    Appearance: Normal appearance.  HENT:     Head: Normocephalic.  Cardiovascular:     Rate and Rhythm: Normal rate and regular rhythm.  Pulmonary:     Effort: Pulmonary effort is normal.     Breath sounds: Normal breath sounds.  Musculoskeletal:     Cervical back: Normal range of motion.  Skin:    General: Skin is warm and dry.     Capillary Refill: Capillary refill takes less than 2 seconds.  Neurological:     Mental Status: She is alert and oriented to person, place, and time.  Psychiatric:        Mood and Affect: Mood normal.    FHT: 145bpm with moderate variability. No decels noted.  Toco: every 2-3 patient states are not painful.   MAU Course  Procedures Orders Placed This Encounter  Procedures   Protein / creatinine ratio, urine   Urinalysis, Routine w reflex microscopic -Urine, Clean Catch   CBC   Comprehensive metabolic panel   Notify physician (specify) Confirmatory reading of BP> 160/110 15 minutes later   Apply Hypertensive Disorders of Pregnancy Care Plan   Measure blood pressure   Results for orders placed or performed during the hospital encounter of 05/17/22 (from the past 24 hour(s))  Protein / creatinine ratio, urine     Status: Abnormal   Collection Time: 05/17/22  7:05 PM  Result Value Ref Range   Creatinine, Urine 155 mg/dL   Total Protein, Urine 117 mg/dL   Protein Creatinine Ratio 0.75 (H) 0.00 - 0.15 mg/mg[Cre]  Urinalysis, Routine w reflex  microscopic -Urine, Clean Catch     Status: Abnormal   Collection Time: 05/17/22  7:28 PM  Result Value Ref Range   Color, Urine YELLOW YELLOW   APPearance HAZY (A) CLEAR   Specific Gravity, Urine 1.014 1.005 - 1.030   pH 6.0 5.0 - 8.0   Glucose, UA NEGATIVE NEGATIVE mg/dL   Hgb urine dipstick NEGATIVE NEGATIVE  Bilirubin Urine NEGATIVE NEGATIVE   Ketones, ur NEGATIVE NEGATIVE mg/dL   Protein, ur 100 (A) NEGATIVE mg/dL   Nitrite NEGATIVE NEGATIVE   Leukocytes,Ua NEGATIVE NEGATIVE   RBC / HPF 0-5 0 - 5 RBC/hpf   WBC, UA 0-5 0 - 5 WBC/hpf   Bacteria, UA NONE SEEN NONE SEEN   Squamous Epithelial / HPF 6-10 0 - 5 /HPF   Amorphous Crystal PRESENT   CBC     Status: Abnormal   Collection Time: 05/17/22  7:56 PM  Result Value Ref Range   WBC 7.7 4.0 - 10.5 K/uL   RBC 4.30 3.87 - 5.11 MIL/uL   Hemoglobin 11.7 (L) 12.0 - 15.0 g/dL   HCT 35.3 (L) 36.0 - 46.0 %   MCV 82.1 80.0 - 100.0 fL   MCH 27.2 26.0 - 34.0 pg   MCHC 33.1 30.0 - 36.0 g/dL   RDW 13.8 11.5 - 15.5 %   Platelets 387 150 - 400 K/uL   nRBC 0.0 0.0 - 0.2 %   Patient Vitals for the past 24 hrs:  BP Temp Temp src Pulse Resp SpO2 Height Weight  05/17/22 2103 129/85 -- -- (!) 101 -- -- -- --  05/17/22 2045 (!) 148/113 -- -- 89 -- 100 % -- --  05/17/22 2032 (!) 152/102 -- -- 89 -- -- -- --  05/17/22 2030 -- -- -- -- -- 100 % -- --  05/17/22 2024 (!) 141/101 -- -- (!) 103 -- 99 % -- --  05/17/22 2020 -- -- -- -- -- 100 % -- --  05/17/22 2015 -- -- -- -- -- 100 % -- --  05/17/22 2010 -- -- -- -- -- 100 % -- --  05/17/22 2009 (!) 144/113 -- -- 100 -- -- -- --  05/17/22 2000 -- -- -- -- -- 100 % -- --  05/17/22 1955 -- -- -- -- -- 100 % -- --  05/17/22 1944 (!) 152/102 -- -- (!) 102 -- 100 % -- --  05/17/22 1923 (!) 162/95 98.2 F (36.8 C) Oral (!) 115 20 100 % 5\' 7"  (1.702 m) (!) 171.6 kg     MDM - Patient had 2 severe range BPs in MAU. Labetalol protocol initiated.  - Labetalol x1 and BPs improved.  - @ 2045 patient  had another severe range BP. 2nd Dose of Labetalol given. - PCR elevated at 0.72.  - Consulted Dr. Harolyn Rutherford, on patient presentation and current clinical picture. Reviewed preE features and severe range BPs. Per MD admit to L&D and for IOL for PReE with severe features.    Assessment and Plan  Admit to L&D.   Tammie Doe, MSN CNM  05/17/2022, 8:22 PM

## 2022-05-17 NOTE — MAU Note (Signed)
.  Jodene Polyak is a 32 y.o. at [redacted]w[redacted]d here in MAU reporting: took BP meds at 1100 - has not taken nightly dose yet. [facility] to [employer] came back home "felt weird" - weak and short of breath. Took BP - 147/126 and 146/117. States has pressure in lower back and sinus pressure, but denies pain, HA, visual changes, epigastric pain, VB, or LOF. +FM.   Onset of complaint: 1830 Pain score: 0 Vitals:   05/17/22 1923  BP: (!) 162/95  Pulse: (!) 115  Resp: 20  Temp: 98.2 F (36.8 C)  SpO2: 100%     FHT:145 Lab orders placed from triage:  UA

## 2022-05-17 NOTE — H&P (Signed)
OBSTETRIC ADMISSION HISTORY AND PHYSICAL  Tammie Thomas is a 32 y.o. female G1P0 with IUP at [redacted]w[redacted]d by LMP presenting for elevated blood pressures. She denies any history of chronic hypertension, on nifedipine 60 mg twice daily.  Noted to have high blood pressures presented to the [facility], where she was diagnosed with cHTN with superimposed pre-eclampsia with severe features. She reports +FMs, No LOF, no VB, no blurry vision, headaches or peripheral edema, and RUQ pain.  She plans on breast feeding. She request is unsure about birth control, but considering a postpartum IUD She received her prenatal care at [facility]   Dating: By LMP --->  Estimated Date of Delivery: 06/14/22  Sono:    @[redacted]w[redacted]d , CWD, normal anatomy, cephalicf presentation, [id number], 83% EFW   Prenatal History/Complications:  - [id number] (vs possible preDM and A2GDM) on insulin - chronic hypertension with SIPE, now with severe Fx. - Obesity with BMI ~ 32  Past Medical History: Past Medical History:  Diagnosis Date   Anxiety    Asthma    Cyst of pancreas 03/04/2018   GERD (gastroesophageal reflux disease)    Gestational diabetes    HLD (hyperlipidemia)    Hypertension    Obesity     Past Surgical History: Past Surgical History:  Procedure Laterality Date   NO PAST SURGERIES      Obstetrical History: OB History     Gravida  1   Para      Term      Preterm      AB      Living         SAB      IAB      Ectopic      Multiple      Live Births              Social History Social History   Socioeconomic History   Marital status: Single    Spouse name: Not on file   Number of children: 0   Years of education: Not on file   Highest education level: Not on file  Occupational History   Not on file  Tobacco Use   Smoking status: Former    Types: Cigarettes    Quit date: 02/25/2008    Years since quitting: 14.2   Smokeless tobacco: Never  Vaping Use   Vaping Use: Never used  Substance and Sexual  Activity   Alcohol use: Not Currently   Drug use: No   Sexual activity: Not Currently  Other Topics Concern   Not on file  Social History Narrative   Not on file   Social Determinants of Health   Financial Resource Strain: Not on file  Food Insecurity: No Food Insecurity (05/17/2022)   Hunger Vital Sign    Worried About Running Out of Food in the Last Year: Never true    Ran Out of Food in the Last Year: Never true  Transportation Needs: No Transportation Needs (05/09/2022)   PRAPARE - Hydrologist (Medical): No    Lack of Transportation (Non-Medical): No  Physical Activity: Not on file  Stress: Not on file  Social Connections: Not on file    Family History: Family History  Problem Relation Age of Onset   Heart disease Mother    Hypertension Mother    Diabetes Father    Hypertension Father     Allergies: No Known Allergies  Medications Prior to Admission  Medication Sig Dispense Refill Last Dose  insulin detemir (LEVEMIR FLEXTOUCH) 100 UNIT/ML FlexPen Inject 30 Units into the skin at bedtime. 15 mL 11 05/16/2022   Insulin Pen Needle 33G X 5 MM MISC 1 Units by Does not apply route 5 (five) times daily. 100 each 3 05/16/2022   NIFEdipine (ADALAT CC) 60 MG 24 hr tablet Take 1 tablet (60 mg total) by mouth 2 (two) times daily. 60 tablet 1 05/17/2022   pantoprazole (PROTONIX) 20 MG tablet Take 1 tablet (20 mg total) by mouth daily. 30 tablet 1 05/17/2022   Prenatal Vit-Fe Fumarate-FA (PRENATAL VITAMINS PO) Take by mouth.   05/16/2022   Accu-Chek Softclix Lancets lancets 1 each by Other route 4 (four) times daily. 100 each 12    aspirin EC 81 MG tablet Take 1 tablet (81 mg total) by mouth daily. Take after 12 weeks for prevention of preeclampsia later in pregnancy (Patient not taking: Reported on 02/27/2022) 300 tablet 2    blood glucose meter kit and supplies KIT Dispense based on patient and insurance preference. Use up to four times daily as directed. 1  each 0    citalopram (CELEXA) 10 MG tablet Take 1 tablet (10 mg total) by mouth daily. (Patient not taking: Reported on 03/31/2022) 30 tablet 3    glucose blood test strip Use as instructed 100 each 12      Review of Systems   All systems reviewed and negative except as stated in HPI  Blood pressure 124/60, pulse (!) 112, temperature 98.6 F (37 C), temperature source Oral, resp. rate 18, height 5\' 7"  (1.702 m), weight (!) 171.6 kg, last menstrual period 09/07/2021, SpO2 100 %.  General appearance: alert, cooperative, and appears stated age Lungs: clear to auscultation bilaterally Heart: regular rate and rhythm Abdomen: soft, non-tender; bowel sounds normal Pelvic: see below: Extremities: Homans sign is negative, no sign of DVT DTR's normal  Presentation: cephalic  Fetal monitoring Baseline: 140 bpm, Variability: moderate, Accelerations: +accels, and Decelerations: Absent  Uterine activity: mild, painless, every 2-4 mins  Dilation: 1 Effacement (%): 80 Station: -2 Exam by:: Dr Trina Ao   Prenatal labs: ABO, Rh: --/--/A POS (01/27 1956) Antibody: NEG (01/27 1956) Rubella: 3.89 (10/12 1556) RPR: Non Reactive (12/28 0853)  HBsAg: Negative (10/12 1556)  HIV: Non Reactive (12/28 0853)  GBS:   unknown. Collected. 2 hr Glucola abnormal Genetic screening  results not available Anatomy US - Echogenic intracardiac focus  Prenatal Transfer Tool  Maternal Diabetes: Yes:  Diabetes Type:  Pre-pregnancy Genetic Screening: results not availble Maternal Ultrasounds/Referrals: Isolated EIF (echogenic intracardiac focus) Fetal Ultrasounds or other Referrals:  None Maternal Substance Abuse:  No Significant Maternal Medications:  Meds include: Other: Insulin, procardia. Significant Maternal Lab Results:  None, GBS collected on admission Number of Prenatal Visits:greater than 3 verified prenatal visits Other Comments:  None  Results for orders placed or performed during the hospital  encounter of 05/17/22 (from the past 24 hour(s))  Protein / creatinine ratio, urine   Collection Time: 05/17/22  7:05 PM  Result Value Ref Range   Creatinine, Urine 155 mg/dL   Total Protein, Urine 117 mg/dL   Protein Creatinine Ratio 0.75 (H) 0.00 - 0.15 mg/mg[Cre]  Urinalysis, Routine w reflex microscopic -Urine, Clean Catch   Collection Time: 05/17/22  7:28 PM  Result Value Ref Range   Color, Urine YELLOW YELLOW   APPearance HAZY (A) CLEAR   Specific Gravity, Urine 1.014 1.005 - 1.030   pH 6.0 5.0 - 8.0   Glucose, UA NEGATIVE NEGATIVE mg/dL  Hgb urine dipstick NEGATIVE NEGATIVE   Bilirubin Urine NEGATIVE NEGATIVE   Ketones, ur NEGATIVE NEGATIVE mg/dL   Protein, ur 100 (A) NEGATIVE mg/dL   Nitrite NEGATIVE NEGATIVE   Leukocytes,Ua NEGATIVE NEGATIVE   RBC / HPF 0-5 0 - 5 RBC/hpf   WBC, UA 0-5 0 - 5 WBC/hpf   Bacteria, UA NONE SEEN NONE SEEN   Squamous Epithelial / HPF 6-10 0 - 5 /HPF   Amorphous Crystal PRESENT   CBC   Collection Time: 05/17/22  7:56 PM  Result Value Ref Range   WBC 7.7 4.0 - 10.5 K/uL   RBC 4.30 3.87 - 5.11 MIL/uL   Hemoglobin 11.7 (L) 12.0 - 15.0 g/dL   HCT 35.3 (L) 36.0 - 46.0 %   MCV 82.1 80.0 - 100.0 fL   MCH 27.2 26.0 - 34.0 pg   MCHC 33.1 30.0 - 36.0 g/dL   RDW 13.8 11.5 - 15.5 %   Platelets 387 150 - 400 K/uL   nRBC 0.0 0.0 - 0.2 %  Comprehensive metabolic panel   Collection Time: 05/17/22  7:56 PM  Result Value Ref Range   Sodium 134 (L) 135 - 145 mmol/L   Potassium 4.1 3.5 - 5.1 mmol/L   Chloride 105 98 - 111 mmol/L   CO2 18 (L) 22 - 32 mmol/L   Glucose, Bld 93 70 - 99 mg/dL   BUN 9 6 - 20 mg/dL   Creatinine, Ser 0.81 0.44 - 1.00 mg/dL   Calcium 9.0 8.9 - 10.3 mg/dL   Total Protein 6.4 (L) 6.5 - 8.1 g/dL   Albumin 2.7 (L) 3.5 - 5.0 g/dL   AST 29 15 - 41 U/L   ALT 23 0 - 44 U/L   Alkaline Phosphatase 158 (H) 38 - 126 U/L   Total Bilirubin 0.4 0.3 - 1.2 mg/dL   GFR, Estimated >60 >60 mL/min   Anion gap 11 5 - 15  Type and screen  [place]   Collection Time: 05/17/22  7:56 PM  Result Value Ref Range   ABO/RH(D) A POS    Antibody Screen NEG    Sample Expiration      05/20/2022,2359 Performed at [facility], [address] [place], [place] [address]   Glucose, capillary   Collection Time: 05/17/22 11:10 PM  Result Value Ref Range   Glucose-Capillary 109 (H) 70 - 99 mg/dL    Patient Active Problem List   Diagnosis Date Noted   Pre-eclampsia, severe, antepartum, third trimester 05/17/2022   Chronic hypertension affecting pregnancy 05/09/2022   Type 2 diabetes mellitus without complication, without long-term current use of insulin ([place]) 04/17/2022   Hypertension 04/17/2022   Anxiety 01/30/2022   Maternal morbid obesity, antepartum ([place]) 01/02/2022   Supervision of high risk pregnancy, antepartum 11/28/2021   BMI 50.0-59.9, adult ([place]) 11/28/2021   History of hypertension 11/28/2021   Indigestion 03/04/2018   Cyst of pancreas 03/04/2018    Assessment/Plan:  Dottie Vaquerano is a 32 y.o. G1P0 at [redacted]w[redacted]d here for IOL due to cHTN with SIPE with severe fxs (severe range Blood pressures). She is currently asymptomatic. CMP and CBC unremarkable.   #Labor:Admit for IOL. Cytotec 50/30mcg placed, and FB applied to cervix after receiving consent.  cHTN with SIPE with severe fxs:  -Start Mag sulf for seizure prophylaxis' - continue home nifedipine 60mg  BID - IV hypertensives for severe range BP per protocol  T2DM - start 1/2 home dose - 15 units of lantus nightly -  q4hr BS check in latent labor, q2hr in active labor - consider Endotool if BS staying elevated.  #Pain: IV fentanyl, desires epidural when active. #FWB: Cat 1 #ID:  GBS unknown. Collected; will treat with PCN #MOF: Breast #MOC:undecided, considering postpartum IUD #Circ:  N/a  Sheppard Evens MD MPH OB Fellow, [facility], Center for [facility] 05/18/2022

## 2022-05-18 ENCOUNTER — Inpatient Hospital Stay (HOSPITAL_COMMUNITY): Payer: Medicaid Other | Admitting: Anesthesiology

## 2022-05-18 ENCOUNTER — Other Ambulatory Visit: Payer: Self-pay

## 2022-05-18 LAB — CBC
HCT: 31.8 % — ABNORMAL LOW (ref 36.0–46.0)
HCT: 33.7 % — ABNORMAL LOW (ref 36.0–46.0)
Hemoglobin: 10.9 g/dL — ABNORMAL LOW (ref 12.0–15.0)
Hemoglobin: 11.5 g/dL — ABNORMAL LOW (ref 12.0–15.0)
MCH: 27.6 pg (ref 26.0–34.0)
MCH: 27.5 pg (ref 26.0–34.0)
MCHC: 34.3 g/dL (ref 30.0–36.0)
MCHC: 34.1 g/dL (ref 30.0–36.0)
MCV: 80.5 fL (ref 80.0–100.0)
MCV: 80.6 fL (ref 80.0–100.0)
Platelets: 338 10*3/uL (ref 150–400)
Platelets: 355 10*3/uL (ref 150–400)
RBC: 3.95 MIL/uL (ref 3.87–5.11)
RBC: 4.18 MIL/uL (ref 3.87–5.11)
RDW: 13.8 % (ref 11.5–15.5)
RDW: 14.1 % (ref 11.5–15.5)
WBC: 10.4 10*3/uL (ref 4.0–10.5)
WBC: 11.5 10*3/uL — ABNORMAL HIGH (ref 4.0–10.5)
nRBC: 0 % (ref 0.0–0.2)
nRBC: 0 % (ref 0.0–0.2)

## 2022-05-18 LAB — GLUCOSE, CAPILLARY
Glucose-Capillary: 115 mg/dL — ABNORMAL HIGH (ref 70–99)
Glucose-Capillary: 122 mg/dL — ABNORMAL HIGH (ref 70–99)
Glucose-Capillary: 118 mg/dL — ABNORMAL HIGH (ref 70–99)
Glucose-Capillary: 128 mg/dL — ABNORMAL HIGH (ref 70–99)
Glucose-Capillary: 120 mg/dL — ABNORMAL HIGH (ref 70–99)

## 2022-05-18 LAB — COMPREHENSIVE METABOLIC PANEL
ALT: 26 U/L (ref 0–44)
ALT: 25 U/L (ref 0–44)
AST: 28 U/L (ref 15–41)
AST: 27 U/L (ref 15–41)
Albumin: 2.5 g/dL — ABNORMAL LOW (ref 3.5–5.0)
Albumin: 2.6 g/dL — ABNORMAL LOW (ref 3.5–5.0)
Alkaline Phosphatase: 132 U/L — ABNORMAL HIGH (ref 38–126)
Alkaline Phosphatase: 163 U/L — ABNORMAL HIGH (ref 38–126)
Anion gap: 12 (ref 5–15)
Anion gap: 13 (ref 5–15)
BUN: 11 mg/dL (ref 6–20)
BUN: 7 mg/dL (ref 6–20)
CO2: 16 mmol/L — ABNORMAL LOW (ref 22–32)
CO2: 17 mmol/L — ABNORMAL LOW (ref 22–32)
Calcium: 8.8 mg/dL — ABNORMAL LOW (ref 8.9–10.3)
Calcium: 8.4 mg/dL — ABNORMAL LOW (ref 8.9–10.3)
Chloride: 105 mmol/L (ref 98–111)
Chloride: 102 mmol/L (ref 98–111)
Creatinine, Ser: 0.85 mg/dL (ref 0.44–1.00)
Creatinine, Ser: 0.89 mg/dL (ref 0.44–1.00)
GFR, Estimated: >60 mL/min (ref >60)
GFR, Estimated: >60 mL/min (ref >60)
Glucose, Bld: 101 mg/dL — ABNORMAL HIGH (ref 70–99)
Glucose, Bld: 116 mg/dL — ABNORMAL HIGH (ref 70–99)
Potassium: 4.3 mmol/L (ref 3.5–5.1)
Potassium: 4.2 mmol/L (ref 3.5–5.1)
Sodium: 133 mmol/L — ABNORMAL LOW (ref 135–145)
Sodium: 132 mmol/L — ABNORMAL LOW (ref 135–145)
Total Bilirubin: 0.3 mg/dL (ref 0.3–1.2)
Total Bilirubin: 0.4 mg/dL (ref 0.3–1.2)
Total Protein: 6 g/dL — ABNORMAL LOW (ref 6.5–8.1)
Total Protein: 6.5 g/dL (ref 6.5–8.1)

## 2022-05-18 LAB — MAGNESIUM
Magnesium: 4 mg/dL — ABNORMAL HIGH (ref 1.7–2.4)
Magnesium: 5 mg/dL — ABNORMAL HIGH (ref 1.7–2.4)

## 2022-05-18 LAB — RPR: RPR Ser Ql: NONREACTIVE

## 2022-05-18 MED ORDER — DIPHENHYDRAMINE HCL 50 MG/ML IJ SOLN: 12.5000 mg | INTRAMUSCULAR | Status: DC | PRN

## 2022-05-18 MED ORDER — EPHEDRINE 5 MG/ML INJ: 10.0000 mg | INTRAVENOUS | Status: DC | PRN

## 2022-05-18 MED ORDER — PHENYLEPHRINE 80 MCG/ML (10ML) SYRINGE FOR IV PUSH (FOR BLOOD PRESSURE SUPPORT): 80.0000 ug | PREFILLED_SYRINGE | INTRAVENOUS | Status: DC | PRN

## 2022-05-18 MED ORDER — FENTANYL CITRATE (PF) 100 MCG/2ML IJ SOLN
100.0000 ug | INTRAMUSCULAR | Status: DC | PRN
  Administered 2022-05-18 (×3): 100 ug via INTRAVENOUS
  Filled 2022-05-18 (×2): qty 2

## 2022-05-18 MED ORDER — LACTATED RINGERS IV SOLN
500.0000 mL | Freq: Once | INTRAVENOUS | Status: AC
  Administered 2022-05-18: 500 mL via INTRAVENOUS

## 2022-05-18 MED ORDER — FENTANYL CITRATE (PF) 100 MCG/2ML IJ SOLN
INTRAMUSCULAR | Status: AC
  Filled 2022-05-18: qty 2

## 2022-05-18 MED ORDER — LIDOCAINE-EPINEPHRINE (PF) 1.5 %-1:200000 IJ SOLN
INTRAMUSCULAR | Status: DC | PRN
  Administered 2022-05-18: 5 mL via EPIDURAL

## 2022-05-18 MED ORDER — LACTATED RINGERS IV SOLN: INTRAVENOUS | Status: DC

## 2022-05-18 MED ORDER — LABETALOL HCL 5 MG/ML IV SOLN
20.0000 mg | INTRAVENOUS | Status: DC | PRN
  Administered 2022-05-18: 20 mg via INTRAVENOUS
  Filled 2022-05-18: qty 4

## 2022-05-18 MED ORDER — LABETALOL HCL 5 MG/ML IV SOLN: 80.0000 mg | INTRAVENOUS | Status: DC | PRN

## 2022-05-18 MED ORDER — LABETALOL HCL 5 MG/ML IV SOLN: 40.0000 mg | INTRAVENOUS | Status: DC | PRN

## 2022-05-18 MED ORDER — PHENYLEPHRINE 80 MCG/ML (10ML) SYRINGE FOR IV PUSH (FOR BLOOD PRESSURE SUPPORT)
80.0000 ug | PREFILLED_SYRINGE | INTRAVENOUS | Status: DC | PRN
  Filled 2022-05-18: qty 10

## 2022-05-18 MED ORDER — MAGNESIUM SULFATE BOLUS VIA INFUSION
4.0000 g | Freq: Once | INTRAVENOUS | Status: AC
  Administered 2022-05-18: 4 g via INTRAVENOUS
  Filled 2022-05-18: qty 1000

## 2022-05-18 MED ORDER — HYDRALAZINE HCL 20 MG/ML IJ SOLN: 10.0000 mg | INTRAMUSCULAR | Status: DC | PRN

## 2022-05-18 MED ORDER — FENTANYL-BUPIVACAINE-NACL 0.5-0.125-0.9 MG/250ML-% EP SOLN
12.0000 mL/h | EPIDURAL | Status: DC | PRN
  Administered 2022-05-18: 12 mL/h via EPIDURAL
  Filled 2022-05-18: qty 250

## 2022-05-18 MED ORDER — MAGNESIUM SULFATE 40 GM/1000ML IV SOLN
2.0000 g/h | INTRAVENOUS | Status: AC
  Administered 2022-05-18 – 2022-05-19 (×3): 2 g/h via INTRAVENOUS
  Filled 2022-05-18 (×2): qty 1000

## 2022-05-18 NOTE — Anesthesia Preprocedure Evaluation (Signed)
Anesthesia Evaluation  Patient identified by MRN, date of birth, ID band Patient awake    Reviewed: Allergy & Precautions, NPO status , Patient's Chart, lab work & pertinent test results  Airway Mallampati: III       Dental no notable dental hx.    Pulmonary asthma , former smoker   Pulmonary exam normal        Cardiovascular hypertension,  Rhythm:Regular Rate:Normal     Neuro/Psych   Anxiety     negative neurological ROS     GI/Hepatic Neg liver ROS,GERD  Medicated,,  Endo/Other  diabetes, Type 2, Insulin Dependent, Oral Hypoglycemic Agents  Morbid obesity  Renal/GU negative Renal ROS  negative genitourinary   Musculoskeletal negative musculoskeletal ROS (+)    Abdominal  (+) + obese  Peds  Hematology Lab Results      Component                Value               Date                      WBC                      11.5 (H)            05/18/2022                HGB                      11.5 (L)            05/18/2022                HCT                      33.7 (L)            05/18/2022                MCV                      80.6                05/18/2022                PLT                      355                 05/18/2022              Anesthesia Other Findings   Reproductive/Obstetrics (+) Pregnancy Pre-E                             Anesthesia Physical Anesthesia Plan  ASA: 3  Anesthesia Plan: Epidural   Post-op Pain Management:    Induction:   PONV Risk Score and Plan: 2 and Treatment may vary due to age or medical condition  Airway Management Planned: Natural Airway  Additional Equipment: None  Intra-op Plan:   Post-operative Plan:   Informed Consent: I have reviewed the patients History and Physical, chart, labs and discussed the procedure including the risks, benefits and alternatives for the proposed anesthesia with the patient or authorized representative who has  indicated his/her understanding and acceptance.     Dental advisory given  Plan Discussed with:   Anesthesia Plan Comments:        Anesthesia Quick Evaluation

## 2022-05-18 NOTE — Progress Notes (Signed)
Labor Progress Note Tammie Thomas is a 32 y.o. G1P0 at [redacted]w[redacted]d presented for IOL due to cHTN with SIPE with severe fxs S: doing well, feeling some cramps from her contractions. FB out and has been started on pitocin.  O:  BP (!) 127/56   Pulse (!) 125   Temp 97.9 F (36.6 C) (Oral)   Resp 18   Ht 5\' 7"  (1.702 m)   Wt (!) 171.6 kg   LMP 09/07/2021   SpO2 100%   BMI 59.23 kg/m   EFM: 140 bpm, moderate variability, +accels, no decels  Contractions: mild, irregular, every 2-4 mins.  CVE: Dilation: 4 Effacement (%): 90 Station: -3 Presentation: Vertex Exam by:: Myrtice Lauth, RN   A&P: 32 y.o. G1P0 [redacted]w[redacted]d IOL for cHTN w SIPE w SF.  #Labor: Induction continues. Making progress. S/p cyto X1 + FB. Now on pitocin. Continue titration per protocol. Consider AROM at next check.  #Pain: IV fentanyl, epidural per request when active #FWB: Cat 1 #GBS pending, has had 1 dose of PCN  cHTN w SIPE w SF - blood pressures stable, - on Mag sulf. Labs pending. - continue procardia 60 BID - continue IOL  T2DM: - BS stable - continue lantus 15 units nightly, with SSI. - q4hr BS check, and then q2hrs in active labor.  Jacere Pangborn Sherrilyn Rist, MD 7:49 AM

## 2022-05-18 NOTE — Progress Notes (Signed)
Labor Progress Note Tammie Thomas is a 32 y.o. G1P0 at [redacted]w[redacted]d presented for IOL due to cHTN with SIPE with severe fxs S: doing ok. FB in place and uncomfortable  O:  BP (!) 145/98   Pulse (!) 105   Temp 97.9 F (36.6 C) (Oral)   Resp 18   Ht 5\' 7"  (1.702 m)   Wt (!) 171.6 kg   LMP 09/07/2021   SpO2 100%   BMI 59.23 kg/m  EFM: 140bpm/moderate/+accels, no decels  Contractions: mild, irregular, every 2-4 mins.  CVE: Dilation: 1 Effacement (%): 80 Station: -2 Presentation: Vertex (verified by bedside ultrasound) Exam by:: Dr Trina Ao   A&P: 32 y.o. G1P0 [redacted]w[redacted]d IOL for cHTN w SIPE w SF.  #Labor: Induction continues. S/p cyto X1 round, and FB still in place. Plan to recheck when FB out. #Pain: IV fentanyl, epidural per request when active #FWB: Cat 1 #GBS  collected, will get PCN.  cHTN w SIPE w SF - blood pressures stable, - on Mag sulf.  - labs ordered for 0700 tomorro  - continue procardia 60 BID - continue IOL  T2DM: - BS stable - continue lantus 15 units nightly, with SSI. - q4hr BS check, and then q2hrs in active labor.  Ondre Salvetti Sherrilyn Rist, MD 3:01 AM

## 2022-05-18 NOTE — Progress Notes (Signed)
Labor Progress Note Tammie Thomas is a 32 y.o. G1P0 at [redacted]w[redacted]d presented for IOL due to cHTN with SIPE with severe fxs S: doing well overall. No new concerns. She feels pain from her contractions.  O:  BP 136/87   Pulse (!) 105   Temp 98.1 F (36.7 C) (Oral)   Resp 16   Ht 5\' 7"  (1.702 m)   Wt (!) 171.6 kg   LMP 09/07/2021   SpO2 100%   BMI 59.23 kg/m   EFM: 135bpm, moderate variability, +accels, no decels.  Contractions: every 3 -4 mins  CVE: Dilation: 5 Effacement (%): 80 Cervical Position:  (pt's right) Station: -1 Presentation: Vertex Exam by:: Dr Trina Ao   A&P: 33 y.o. G1P0 [redacted]w[redacted]d IOL for cHTN w SIPE w SF.  #Labor: s/p cytotec X1 + FB + AROM.  Doing well. On pitocin at 28 units. Plan to continue to titrate up per protocol. Can get epidural now. Recheck in 4hrs, sooner if indicated. #Pain: IV fentanyl, epidural per request when active #FWB: Cat 1 #GBS pending, PCN given and adequately treated.  cHTN w SIPE w SF - blood pressures stable, - on Mag sulf. Labs are stable. - continue procardia 60 BID - continue IOL  T2DM: - BS stable - continue lantus 15 units nightly, with SSI. - q4hr BS check, and then q2hrs in active labor.  Tammie Mcclarty Sherrilyn Rist, MD 12:10 AM

## 2022-05-18 NOTE — Progress Notes (Incomplete)
Labor Progress Note Tammie Thomas is a 32 y.o. G1P0 at [redacted]w[redacted]d presented for IOL due to cHTN with SIPE with severe fxs S: doing well overall. No new concerns. She feels pain from her contractions.  O:  BP 124/69   Pulse 97   Temp 98.1 F (36.7 C) (Oral)   Resp 16   Ht 5\' 7"  (1.702 m)   Wt (!) 171.6 kg   LMP 09/07/2021   SpO2 100%   BMI 59.23 kg/m   EFM: 135bpm, moderate variability, +accels, no decels.  Contractions: every 3 -4 mins  CVE: Dilation: 5 Effacement (%): 80 Cervical Position:  (pt's right) Station: -1 Presentation: Vertex Exam by:: Dr Trina Ao   A&P: 32 y.o. G1P0 [redacted]w[redacted]d IOL for cHTN w SIPE w SF.  #Labor: s/p cytotec X1 + FB + AROM.  Doing well. On pitocin at   #Pain: IV fentanyl, epidural per request when active #FWB: Cat 1 #GBS pending, has had 1 dose of PCN  cHTN w SIPE w SF - blood pressures stable, - on Mag sulf. Labs pending. - continue procardia 60 BID - continue IOL  T2DM: - BS stable - continue lantus 15 units nightly, with SSI. - q4hr BS check, and then q2hrs in active labor.  [name], MD 11:59 PM

## 2022-05-18 NOTE — Anesthesia Procedure Notes (Signed)
Epidural Patient location during procedure: OB Start time: 05/18/2022 10:40 PM End time: 05/18/2022 10:50 PM  Staffing Anesthesiologist: Darral Dash, DO Performed: anesthesiologist   Preanesthetic Checklist Completed: patient identified, IV checked, site marked, risks and benefits discussed, surgical consent, monitors and equipment checked, pre-op evaluation and timeout performed  Epidural Patient position: sitting Prep: ChloraPrep Patient monitoring: heart rate, continuous pulse ox and blood pressure Approach: midline Location: L4-L5 Injection technique: LOR saline  Needle:  Needle type: Tuohy  Needle gauge: 17 G Needle length: 9 cm Needle insertion depth: 10 cm Catheter type: closed end flexible Catheter size: 20 Guage Catheter at skin depth: 15 cm Test dose: negative and 1.5% lidocaine with Epi 1:200 K  Assessment Events: blood not aspirated, no cerebrospinal fluid, injection not painful, no injection resistance and no paresthesia  Additional Notes   Patient identified. Risks/Benefits/Options discussed with patient including but not limited to bleeding, infection, nerve damage, paralysis, failed block, incomplete pain control, headache, blood pressure changes, nausea, vomiting, reactions to medications, itching and postpartum back pain. Confirmed with bedside nurse the patient's most recent platelet count. Confirmed with patient that they are not currently taking any anticoagulation, have any bleeding history or any family history of bleeding disorders. Patient expressed understanding and wished to proceed. All questions were answered. Sterile technique was used throughout the entire procedure. Please see nursing notes for vital signs. Test dose was given through epidural catheter and negative prior to continuing to dose epidural or start infusion. Warning signs of high block given to the patient including shortness of breath, tingling/numbness in hands, complete motor  block, or any concerning symptoms with instructions to call for help. Patient was given instructions on fall risk and not to get out of bed. All questions and concerns addressed with instructions to call with any issues or inadequate analgesia.    Reason for block:procedure for pain

## 2022-05-18 NOTE — Progress Notes (Signed)
S/p AROM on 26 of pitocin. Consider need for IUPC at next cervical exam. Cat I

## 2022-05-19 ENCOUNTER — Encounter (HOSPITAL_COMMUNITY): Payer: Self-pay | Admitting: Obstetrics & Gynecology

## 2022-05-19 ENCOUNTER — Encounter (HOSPITAL_COMMUNITY)
Admission: AD | Disposition: A | Discharge status: Home / Self Care | Payer: Self-pay | Source: Home / Self Care | Attending: Obstetrics and Gynecology

## 2022-05-19 ENCOUNTER — Other Ambulatory Visit: Payer: Self-pay

## 2022-05-19 DIAGNOSIS — O24425 Gestational diabetes mellitus in childbirth, controlled by oral hypoglycemic drugs: Secondary | ICD-10-CM

## 2022-05-19 DIAGNOSIS — O114 Pre-existing hypertension with pre-eclampsia, complicating childbirth: Secondary | ICD-10-CM

## 2022-05-19 DIAGNOSIS — O24424 Gestational diabetes mellitus in childbirth, insulin controlled: Secondary | ICD-10-CM | POA: Diagnosis not present

## 2022-05-19 DIAGNOSIS — Z3A36 36 weeks gestation of pregnancy: Secondary | ICD-10-CM

## 2022-05-19 DIAGNOSIS — O1414 Severe pre-eclampsia complicating childbirth: Secondary | ICD-10-CM | POA: Diagnosis not present

## 2022-05-19 DIAGNOSIS — Z98891 History of uterine scar from previous surgery: Secondary | ICD-10-CM

## 2022-05-19 DIAGNOSIS — O2412 Pre-existing diabetes mellitus, type 2, in childbirth: Secondary | ICD-10-CM | POA: Diagnosis not present

## 2022-05-19 LAB — CBC
HCT: 33.3 % — ABNORMAL LOW (ref 36.0–46.0)
HCT: 34.6 % — ABNORMAL LOW (ref 36.0–46.0)
HCT: 32 % — ABNORMAL LOW (ref 36.0–46.0)
Hemoglobin: 11 g/dL — ABNORMAL LOW (ref 12.0–15.0)
Hemoglobin: 11.8 g/dL — ABNORMAL LOW (ref 12.0–15.0)
Hemoglobin: 10.7 g/dL — ABNORMAL LOW (ref 12.0–15.0)
MCH: 27 pg (ref 26.0–34.0)
MCH: 27.6 pg (ref 26.0–34.0)
MCH: 27.1 pg (ref 26.0–34.0)
MCHC: 33 g/dL (ref 30.0–36.0)
MCHC: 34.1 g/dL (ref 30.0–36.0)
MCHC: 33.4 g/dL (ref 30.0–36.0)
MCV: 81.8 fL (ref 80.0–100.0)
MCV: 81 fL (ref 80.0–100.0)
MCV: 81 fL (ref 80.0–100.0)
Platelets: 340 10*3/uL (ref 150–400)
Platelets: 328 10*3/uL (ref 150–400)
Platelets: 331 10*3/uL (ref 150–400)
RBC: 4.07 MIL/uL (ref 3.87–5.11)
RBC: 4.27 MIL/uL (ref 3.87–5.11)
RBC: 3.95 MIL/uL (ref 3.87–5.11)
RDW: 14.1 % (ref 11.5–15.5)
RDW: 14.2 % (ref 11.5–15.5)
RDW: 14 % (ref 11.5–15.5)
WBC: 13 10*3/uL — ABNORMAL HIGH (ref 4.0–10.5)
WBC: 15 10*3/uL — ABNORMAL HIGH (ref 4.0–10.5)
WBC: 16.8 10*3/uL — ABNORMAL HIGH (ref 4.0–10.5)
nRBC: 0 % (ref 0.0–0.2)
nRBC: 0 % (ref 0.0–0.2)
nRBC: 0 % (ref 0.0–0.2)

## 2022-05-19 LAB — COMPREHENSIVE METABOLIC PANEL
ALT: 25 U/L (ref 0–44)
ALT: 26 U/L (ref 0–44)
ALT: 23 U/L (ref 0–44)
AST: 26 U/L (ref 15–41)
AST: 29 U/L (ref 15–41)
AST: 40 U/L (ref 15–41)
Albumin: 2.5 g/dL — ABNORMAL LOW (ref 3.5–5.0)
Albumin: 2.5 g/dL — ABNORMAL LOW (ref 3.5–5.0)
Albumin: 2.3 g/dL — ABNORMAL LOW (ref 3.5–5.0)
Alkaline Phosphatase: 157 U/L — ABNORMAL HIGH (ref 38–126)
Alkaline Phosphatase: 167 U/L — ABNORMAL HIGH (ref 38–126)
Alkaline Phosphatase: 162 U/L — ABNORMAL HIGH (ref 38–126)
Anion gap: 13 (ref 5–15)
Anion gap: 11 (ref 5–15)
Anion gap: 8 (ref 5–15)
BUN: 8 mg/dL (ref 6–20)
BUN: 8 mg/dL (ref 6–20)
BUN: 7 mg/dL (ref 6–20)
CO2: 17 mmol/L — ABNORMAL LOW (ref 22–32)
CO2: 19 mmol/L — ABNORMAL LOW (ref 22–32)
CO2: 20 mmol/L — ABNORMAL LOW (ref 22–32)
Calcium: 8.2 mg/dL — ABNORMAL LOW (ref 8.9–10.3)
Calcium: 8 mg/dL — ABNORMAL LOW (ref 8.9–10.3)
Calcium: 8 mg/dL — ABNORMAL LOW (ref 8.9–10.3)
Chloride: 101 mmol/L (ref 98–111)
Chloride: 104 mmol/L (ref 98–111)
Chloride: 106 mmol/L (ref 98–111)
Creatinine, Ser: 0.97 mg/dL (ref 0.44–1.00)
Creatinine, Ser: 0.99 mg/dL (ref 0.44–1.00)
Creatinine, Ser: 0.94 mg/dL (ref 0.44–1.00)
GFR, Estimated: >60 mL/min (ref >60)
GFR, Estimated: >60 mL/min (ref >60)
GFR, Estimated: >60 mL/min (ref >60)
Glucose, Bld: 116 mg/dL — ABNORMAL HIGH (ref 70–99)
Glucose, Bld: 114 mg/dL — ABNORMAL HIGH (ref 70–99)
Glucose, Bld: 186 mg/dL — ABNORMAL HIGH (ref 70–99)
Potassium: 4.1 mmol/L (ref 3.5–5.1)
Potassium: 4.2 mmol/L (ref 3.5–5.1)
Potassium: 4.7 mmol/L (ref 3.5–5.1)
Sodium: 131 mmol/L — ABNORMAL LOW (ref 135–145)
Sodium: 134 mmol/L — ABNORMAL LOW (ref 135–145)
Sodium: 134 mmol/L — ABNORMAL LOW (ref 135–145)
Total Bilirubin: 0.4 mg/dL (ref 0.3–1.2)
Total Bilirubin: 0.5 mg/dL (ref 0.3–1.2)
Total Bilirubin: 0.7 mg/dL (ref 0.3–1.2)
Total Protein: 6.3 g/dL — ABNORMAL LOW (ref 6.5–8.1)
Total Protein: 6.5 g/dL (ref 6.5–8.1)
Total Protein: 6.1 g/dL — ABNORMAL LOW (ref 6.5–8.1)

## 2022-05-19 LAB — GLUCOSE, CAPILLARY
Glucose-Capillary: 110 mg/dL — ABNORMAL HIGH (ref 70–99)
Glucose-Capillary: 132 mg/dL — ABNORMAL HIGH (ref 70–99)
Glucose-Capillary: 134 mg/dL — ABNORMAL HIGH (ref 70–99)
Glucose-Capillary: 143 mg/dL — ABNORMAL HIGH (ref 70–99)
Glucose-Capillary: 146 mg/dL — ABNORMAL HIGH (ref 70–99)
Glucose-Capillary: 172 mg/dL — ABNORMAL HIGH (ref 70–99)

## 2022-05-19 LAB — CULTURE, BETA STREP (GROUP B ONLY)

## 2022-05-19 LAB — MAGNESIUM
Magnesium: 5.6 mg/dL — ABNORMAL HIGH (ref 1.7–2.4)
Magnesium: 5.3 mg/dL — ABNORMAL HIGH (ref 1.7–2.4)
Magnesium: 4.7 mg/dL — ABNORMAL HIGH (ref 1.7–2.4)

## 2022-05-19 SURGERY — Surgical Case
Anesthesia: Epidural | Site: Abdomen

## 2022-05-19 MED ORDER — SCOPOLAMINE 1 MG/3DAYS TD PT72
1.0000 | MEDICATED_PATCH | Freq: Once | TRANSDERMAL | Status: AC
  Administered 2022-05-19: 1.5 mg via TRANSDERMAL

## 2022-05-19 MED ORDER — MAGNESIUM HYDROXIDE 400 MG/5ML PO SUSP: 30.0000 mL | ORAL | Status: DC | PRN

## 2022-05-19 MED ORDER — KETOROLAC TROMETHAMINE 30 MG/ML IJ SOLN
30.0000 mg | Freq: Once | INTRAMUSCULAR | Status: AC | PRN
  Administered 2022-05-19: 30 mg via INTRAVENOUS

## 2022-05-19 MED ORDER — PROMETHAZINE HCL 25 MG/ML IJ SOLN: 6.2500 mg | INTRAMUSCULAR | Status: DC | PRN

## 2022-05-19 MED ORDER — LACTATED RINGERS IV SOLN: INTRAVENOUS | Status: DC

## 2022-05-19 MED ORDER — KETOROLAC TROMETHAMINE 30 MG/ML IJ SOLN
INTRAMUSCULAR | Status: AC
  Filled 2022-05-19: qty 1

## 2022-05-19 MED ORDER — SODIUM CHLORIDE 0.9 % IV SOLN
500.0000 mg | INTRAVENOUS | Status: AC
  Administered 2022-05-19: 500 mg via INTRAVENOUS
  Filled 2022-05-19: qty 5

## 2022-05-19 MED ORDER — IBUPROFEN 600 MG PO TABS
600.0000 mg | ORAL_TABLET | Freq: Four times a day (QID) | ORAL | Status: DC
  Administered 2022-05-20 – 2022-05-22 (×8): 600 mg via ORAL
  Filled 2022-05-19 (×8): qty 1

## 2022-05-19 MED ORDER — MORPHINE SULFATE (PF) 0.5 MG/ML IJ SOLN
INTRAMUSCULAR | Status: AC
  Filled 2022-05-19: qty 10

## 2022-05-19 MED ORDER — DEXMEDETOMIDINE HCL IN NACL 80 MCG/20ML IV SOLN
INTRAVENOUS | Status: DC | PRN
  Administered 2022-05-19 (×2): 4 ug via INTRAVENOUS

## 2022-05-19 MED ORDER — OXYTOCIN-SODIUM CHLORIDE 30-0.9 UT/500ML-% IV SOLN
INTRAVENOUS | Status: DC | PRN
  Administered 2022-05-19: 500 mL via INTRAVENOUS

## 2022-05-19 MED ORDER — SODIUM BICARBONATE 8.4 % IV SOLN
INTRAVENOUS | Status: DC | PRN
  Administered 2022-05-19: 2 mL via EPIDURAL

## 2022-05-19 MED ORDER — CARBOPROST TROMETHAMINE 250 MCG/ML IM SOLN
INTRAMUSCULAR | Status: AC
  Filled 2022-05-19: qty 1

## 2022-05-19 MED ORDER — LIDOCAINE-EPINEPHRINE (PF) 2 %-1:200000 IJ SOLN
INTRAMUSCULAR | Status: DC | PRN
  Administered 2022-05-19: 3 mL via EPIDURAL
  Administered 2022-05-19: 2 mL via EPIDURAL
  Administered 2022-05-19: 3 mL via EPIDURAL
  Administered 2022-05-19: 2 mL via EPIDURAL

## 2022-05-19 MED ORDER — PRENATAL MULTIVITAMIN CH
1.0000 | ORAL_TABLET | Freq: Every day | ORAL | Status: DC
  Administered 2022-05-20 – 2022-05-22 (×3): 1 via ORAL
  Filled 2022-05-19 (×3): qty 1

## 2022-05-19 MED ORDER — SIMETHICONE 80 MG PO CHEW
80.0000 mg | CHEWABLE_TABLET | ORAL | Status: DC | PRN
  Administered 2022-05-21: 80 mg via ORAL
  Filled 2022-05-19: qty 1

## 2022-05-19 MED ORDER — FENTANYL CITRATE (PF) 100 MCG/2ML IJ SOLN
INTRAMUSCULAR | Status: DC | PRN
  Administered 2022-05-19: 100 ug via EPIDURAL

## 2022-05-19 MED ORDER — OXYCODONE HCL 5 MG PO TABS: 5.0000 mg | ORAL_TABLET | ORAL | Status: DC | PRN

## 2022-05-19 MED ORDER — METHYLERGONOVINE MALEATE 0.2 MG/ML IJ SOLN
INTRAMUSCULAR | Status: DC | PRN
  Administered 2022-05-19: .2 mg via INTRAMUSCULAR

## 2022-05-19 MED ORDER — ONDANSETRON HCL 4 MG/2ML IJ SOLN
INTRAMUSCULAR | Status: DC | PRN
  Administered 2022-05-19: 4 mg via INTRAVENOUS

## 2022-05-19 MED ORDER — DEXAMETHASONE SODIUM PHOSPHATE 4 MG/ML IJ SOLN
INTRAMUSCULAR | Status: DC | PRN
  Administered 2022-05-19: 4 mg via INTRAVENOUS

## 2022-05-19 MED ORDER — ZOLPIDEM TARTRATE 5 MG PO TABS: 5.0000 mg | ORAL_TABLET | Freq: Every evening | ORAL | Status: DC | PRN

## 2022-05-19 MED ORDER — COCONUT OIL OIL: 1.0000 | TOPICAL_OIL | Status: DC | PRN

## 2022-05-19 MED ORDER — ONDANSETRON HCL 4 MG/2ML IJ SOLN
INTRAMUSCULAR | Status: AC
  Filled 2022-05-19: qty 2

## 2022-05-19 MED ORDER — MAGNESIUM SULFATE 40 GM/1000ML IV SOLN
INTRAVENOUS | Status: AC
  Filled 2022-05-19: qty 1000

## 2022-05-19 MED ORDER — ENOXAPARIN SODIUM 100 MG/ML IJ SOSY
90.0000 mg | PREFILLED_SYRINGE | INTRAMUSCULAR | Status: DC
  Administered 2022-05-20 – 2022-05-22 (×3): 90 mg via SUBCUTANEOUS
  Filled 2022-05-19 (×4): qty 0.9

## 2022-05-19 MED ORDER — HYDROMORPHONE HCL 1 MG/ML IJ SOLN
INTRAMUSCULAR | Status: AC
  Filled 2022-05-19: qty 0.5

## 2022-05-19 MED ORDER — METHYLERGONOVINE MALEATE 0.2 MG/ML IJ SOLN
INTRAMUSCULAR | Status: AC
  Filled 2022-05-19: qty 1

## 2022-05-19 MED ORDER — DEXMEDETOMIDINE HCL IN NACL 80 MCG/20ML IV SOLN
INTRAVENOUS | Status: AC
  Filled 2022-05-19: qty 20

## 2022-05-19 MED ORDER — METOCLOPRAMIDE HCL 5 MG/ML IJ SOLN
INTRAMUSCULAR | Status: DC | PRN
  Administered 2022-05-19: 10 mg via INTRAVENOUS

## 2022-05-19 MED ORDER — DIBUCAINE (PERIANAL) 1 % EX OINT: 1.0000 | TOPICAL_OINTMENT | CUTANEOUS | Status: DC | PRN

## 2022-05-19 MED ORDER — OXYTOCIN-SODIUM CHLORIDE 30-0.9 UT/500ML-% IV SOLN
INTRAVENOUS | Status: AC
  Filled 2022-05-19: qty 500

## 2022-05-19 MED ORDER — MORPHINE SULFATE (PF) 0.5 MG/ML IJ SOLN
INTRAMUSCULAR | Status: DC | PRN
  Administered 2022-05-19: 3 mg via EPIDURAL

## 2022-05-19 MED ORDER — MEPERIDINE HCL 25 MG/ML IJ SOLN: 6.2500 mg | INTRAMUSCULAR | Status: DC | PRN

## 2022-05-19 MED ORDER — METOCLOPRAMIDE HCL 5 MG/ML IJ SOLN
INTRAMUSCULAR | Status: AC
  Filled 2022-05-19: qty 2

## 2022-05-19 MED ORDER — OXYTOCIN-SODIUM CHLORIDE 30-0.9 UT/500ML-% IV SOLN
2.5000 [IU]/h | INTRAVENOUS | Status: AC
  Administered 2022-05-19: 2.5 [IU]/h via INTRAVENOUS
  Filled 2022-05-19: qty 500

## 2022-05-19 MED ORDER — CHLOROPROCAINE HCL (PF) 3 % IJ SOLN
INTRAMUSCULAR | Status: AC
  Filled 2022-05-19: qty 20

## 2022-05-19 MED ORDER — KETOROLAC TROMETHAMINE 30 MG/ML IJ SOLN
30.0000 mg | Freq: Four times a day (QID) | INTRAMUSCULAR | Status: AC
  Administered 2022-05-19 – 2022-05-20 (×3): 30 mg via INTRAVENOUS
  Filled 2022-05-19 (×3): qty 1

## 2022-05-19 MED ORDER — PHENYLEPHRINE 80 MCG/ML (10ML) SYRINGE FOR IV PUSH (FOR BLOOD PRESSURE SUPPORT)
PREFILLED_SYRINGE | INTRAVENOUS | Status: AC
  Filled 2022-05-19: qty 10

## 2022-05-19 MED ORDER — GABAPENTIN 100 MG PO CAPS
200.0000 mg | ORAL_CAPSULE | Freq: Every day | ORAL | Status: DC
  Administered 2022-05-19 – 2022-05-21 (×3): 200 mg via ORAL
  Filled 2022-05-19 (×3): qty 2

## 2022-05-19 MED ORDER — DIPHENHYDRAMINE HCL 25 MG PO CAPS
25.0000 mg | ORAL_CAPSULE | Freq: Four times a day (QID) | ORAL | Status: DC | PRN
  Administered 2022-05-19: 25 mg via ORAL
  Filled 2022-05-19: qty 1

## 2022-05-19 MED ORDER — SOD CITRATE-CITRIC ACID 500-334 MG/5ML PO SOLN
30.0000 mL | ORAL | Status: AC
  Administered 2022-05-19: 30 mL via ORAL

## 2022-05-19 MED ORDER — MENTHOL 3 MG MT LOZG: 1.0000 | LOZENGE | OROMUCOSAL | Status: DC | PRN

## 2022-05-19 MED ORDER — HYDROMORPHONE HCL 1 MG/ML IJ SOLN
0.2500 mg | INTRAMUSCULAR | Status: DC | PRN
  Administered 2022-05-19: 0.25 mg via INTRAVENOUS

## 2022-05-19 MED ORDER — METHYLERGONOVINE MALEATE 0.2 MG PO TABS
0.2000 mg | ORAL_TABLET | Freq: Four times a day (QID) | ORAL | Status: AC
  Administered 2022-05-19 – 2022-05-20 (×4): 0.2 mg via ORAL
  Filled 2022-05-19 (×4): qty 1

## 2022-05-19 MED ORDER — STERILE WATER FOR IRRIGATION IR SOLN
Status: DC | PRN
  Administered 2022-05-19: 1000 mL

## 2022-05-19 MED ORDER — DEXAMETHASONE SODIUM PHOSPHATE 4 MG/ML IJ SOLN
INTRAMUSCULAR | Status: AC
  Filled 2022-05-19: qty 1

## 2022-05-19 MED ORDER — FENTANYL CITRATE (PF) 250 MCG/5ML IJ SOLN
INTRAMUSCULAR | Status: AC
  Filled 2022-05-19: qty 5

## 2022-05-19 MED ORDER — ACETAMINOPHEN 500 MG PO TABS
1000.0000 mg | ORAL_TABLET | Freq: Four times a day (QID) | ORAL | Status: DC
  Administered 2022-05-19 – 2022-05-22 (×11): 1000 mg via ORAL
  Filled 2022-05-19 (×11): qty 2

## 2022-05-19 MED ORDER — INSULIN GLARGINE-YFGN 100 UNIT/ML ~~LOC~~ SOLN
15.0000 [IU] | Freq: Every day | SUBCUTANEOUS | Status: DC
  Administered 2022-05-19 – 2022-05-21 (×3): 15 [IU] via SUBCUTANEOUS
  Filled 2022-05-19 (×4): qty 0.15

## 2022-05-19 MED ORDER — CEFAZOLIN IN SODIUM CHLORIDE 3-0.9 GM/100ML-% IV SOLN
3.0000 g | INTRAVENOUS | Status: DC
  Filled 2022-05-19: qty 100

## 2022-05-19 MED ORDER — SCOPOLAMINE 1 MG/3DAYS TD PT72
MEDICATED_PATCH | TRANSDERMAL | Status: AC
  Filled 2022-05-19: qty 1

## 2022-05-19 MED ORDER — SIMETHICONE 80 MG PO CHEW
80.0000 mg | CHEWABLE_TABLET | Freq: Three times a day (TID) | ORAL | Status: DC
  Administered 2022-05-20 – 2022-05-22 (×8): 80 mg via ORAL
  Filled 2022-05-19 (×8): qty 1

## 2022-05-19 MED ORDER — FENTANYL CITRATE (PF) 250 MCG/5ML IJ SOLN
INTRAMUSCULAR | Status: DC | PRN
  Administered 2022-05-19 (×4): 50 ug via INTRAVENOUS

## 2022-05-19 MED ORDER — PHENYLEPHRINE HCL (PRESSORS) 10 MG/ML IV SOLN
INTRAVENOUS | Status: DC | PRN
  Administered 2022-05-19 (×5): 80 ug via INTRAVENOUS
  Administered 2022-05-19: 160 ug via INTRAVENOUS

## 2022-05-19 MED ORDER — SENNOSIDES-DOCUSATE SODIUM 8.6-50 MG PO TABS
2.0000 | ORAL_TABLET | Freq: Every day | ORAL | Status: DC
  Administered 2022-05-20 – 2022-05-22 (×3): 2 via ORAL
  Filled 2022-05-19 (×3): qty 2

## 2022-05-19 MED ORDER — LIDOCAINE-EPINEPHRINE (PF) 2 %-1:200000 IJ SOLN
INTRAMUSCULAR | Status: AC
  Filled 2022-05-19: qty 20

## 2022-05-19 MED ORDER — FUROSEMIDE 20 MG PO TABS
20.0000 mg | ORAL_TABLET | Freq: Two times a day (BID) | ORAL | Status: DC
  Administered 2022-05-20 – 2022-05-22 (×5): 20 mg via ORAL
  Filled 2022-05-19 (×5): qty 1

## 2022-05-19 MED ORDER — ACETAMINOPHEN 10 MG/ML IV SOLN
INTRAVENOUS | Status: AC
  Filled 2022-05-19: qty 200

## 2022-05-19 MED ORDER — CARBOPROST TROMETHAMINE 250 MCG/ML IM SOLN
INTRAMUSCULAR | Status: DC | PRN
  Administered 2022-05-19 (×2): 250 ug via INTRAMUSCULAR

## 2022-05-19 MED ORDER — TRANEXAMIC ACID-NACL 1000-0.7 MG/100ML-% IV SOLN
INTRAVENOUS | Status: AC
  Filled 2022-05-19: qty 100

## 2022-05-19 MED ORDER — INSULIN ASPART 100 UNIT/ML IJ SOLN: 0.0000 [IU] | Freq: Three times a day (TID) | INTRAMUSCULAR | Status: DC

## 2022-05-19 MED ORDER — CHLOROPROCAINE HCL (PF) 3 % IJ SOLN
INTRAMUSCULAR | Status: DC | PRN
  Administered 2022-05-19 (×2): 20 mL

## 2022-05-19 MED ORDER — WITCH HAZEL-GLYCERIN EX PADS: 1.0000 | MEDICATED_PAD | CUTANEOUS | Status: DC | PRN

## 2022-05-19 MED ORDER — FENTANYL CITRATE (PF) 100 MCG/2ML IJ SOLN
INTRAMUSCULAR | Status: AC
  Filled 2022-05-19: qty 2

## 2022-05-19 MED ORDER — DEXTROSE 5 % IV SOLN
INTRAVENOUS | Status: DC | PRN
  Administered 2022-05-19: 3 g via INTRAVENOUS

## 2022-05-19 MED ORDER — TRANEXAMIC ACID-NACL 1000-0.7 MG/100ML-% IV SOLN
INTRAVENOUS | Status: DC | PRN
  Administered 2022-05-19: 1000 mg via INTRAVENOUS

## 2022-05-19 MED ORDER — MISOPROSTOL 200 MCG PO TABS
ORAL_TABLET | ORAL | Status: AC
  Filled 2022-05-19: qty 4

## 2022-05-19 MED ORDER — SODIUM CHLORIDE 0.9 % IR SOLN
Status: DC | PRN
  Administered 2022-05-19: 1000 mL

## 2022-05-19 SURGICAL SUPPLY — 40 items
BENZOIN TINCTURE PRP APPL 2/3 (GAUZE/BANDAGES/DRESSINGS) IMPLANT
CANISTER PREVENA PLUS 150 (CANNISTER) IMPLANT
CHLORAPREP W/TINT 26 (MISCELLANEOUS) ×2 IMPLANT
CLAMP UMBILICAL CORD (MISCELLANEOUS) ×1 IMPLANT
CLOTH BEACON ORANGE TIMEOUT ST (SAFETY) ×1 IMPLANT
DRESSING PREVENA PLUS CUSTOM (GAUZE/BANDAGES/DRESSINGS) IMPLANT
DRSG OPSITE POSTOP 4X10 (GAUZE/BANDAGES/DRESSINGS) ×1 IMPLANT
DRSG PREVENA PLUS CUSTOM (GAUZE/BANDAGES/DRESSINGS) ×1
ELECT REM PT RETURN 9FT ADLT (ELECTROSURGICAL) ×1
ELECTRODE REM PT RTRN 9FT ADLT (ELECTROSURGICAL) ×1 IMPLANT
EXTRACTOR VACUUM BELL STYLE (SUCTIONS) IMPLANT
GAUZE SPONGE 4X4 12PLY STRL LF (GAUZE/BANDAGES/DRESSINGS) IMPLANT
GLOVE BIOGEL PI IND STRL 7.0 (GLOVE) ×2 IMPLANT
GLOVE ECLIPSE 7.0 STRL STRAW (GLOVE) ×1 IMPLANT
GOWN STRL REUS W/TWL LRG LVL3 (GOWN DISPOSABLE) ×2 IMPLANT
KIT ABG SYR 3ML LUER SLIP (SYRINGE) ×1 IMPLANT
MAT PREVALON FULL STRYKER (MISCELLANEOUS) IMPLANT
NDL HYPO 25X5/8 SAFETYGLIDE (NEEDLE) ×1 IMPLANT
NEEDLE HYPO 25X5/8 SAFETYGLIDE (NEEDLE) ×1 IMPLANT
NS IRRIG 1000ML POUR BTL (IV SOLUTION) ×1 IMPLANT
PACK C SECTION WH (CUSTOM PROCEDURE TRAY) ×1 IMPLANT
PAD ABD 7.5X8 STRL (GAUZE/BANDAGES/DRESSINGS) IMPLANT
PAD OB MATERNITY 4.3X12.25 (PERSONAL CARE ITEMS) ×1 IMPLANT
RETRACTOR TRAXI PANNICULUS (MISCELLANEOUS) IMPLANT
RTRCTR C-SECT PINK 25CM LRG (MISCELLANEOUS) ×1 IMPLANT
STRIP CLOSURE SKIN 1/2X4 (GAUZE/BANDAGES/DRESSINGS) IMPLANT
SUT MNCRL 0 VIOLET CTX 36 (SUTURE) ×2 IMPLANT
SUT MONOCRYL 0 CTX 36 (SUTURE) ×2
SUT PLAIN 0 NONE (SUTURE) IMPLANT
SUT PLAIN 2 0 (SUTURE)
SUT PLAIN 2 0 XLH (SUTURE) IMPLANT
SUT PLAIN ABS 2-0 CT1 27XMFL (SUTURE) IMPLANT
SUT VIC AB 0 CTX 36 (SUTURE) ×1
SUT VIC AB 0 CTX36XBRD ANBCTRL (SUTURE) ×1 IMPLANT
SUT VIC AB 2-0 CT1 27 (SUTURE)
SUT VIC AB 2-0 CT1 TAPERPNT 27 (SUTURE) IMPLANT
SUT VIC AB 4-0 KS 27 (SUTURE) ×1 IMPLANT
TOWEL OR 17X24 6PK STRL BLUE (TOWEL DISPOSABLE) ×1 IMPLANT
TRAY FOLEY W/BAG SLVR 14FR LF (SET/KITS/TRAYS/PACK) IMPLANT
WATER STERILE IRR 1000ML POUR (IV SOLUTION) ×1 IMPLANT

## 2022-05-19 NOTE — Inpatient Diabetes Management (Signed)
Inpatient Diabetes Program Recommendations  AACE/ADA: New Consensus Statement on Inpatient Glycemic Control (2015)  Target Ranges:  Prepandial:   less than 140 mg/dL      Peak postprandial:   less than 180 mg/dL (1-2 hours)      Critically ill patients:  140 - 180 mg/dL   Lab Results  Component Value Date   GLUCAP 146 (H) 05/19/2022   HGBA1C 6.4 (H) 01/30/2022    Review of Glycemic Control  Latest Reference Range & Units 05/18/22 20:49 05/19/22 00:52 05/19/22 04:57  Glucose-Capillary 70 - 99 mg/dL 120 (H) 134 (H) 146 (H)  (H): Data is abnormally high Diabetes history: Type 2 DM Outpatient Diabetes medications: Levemir 30 units QHS Prior to Pregnancy: none Current orders for Inpatient glycemic control: Novolog 0-14 units QHS, Semglee 15 units QHS  Inpatient Diabetes Program Recommendations:    At this time, consider discontinuing Semglee.   If blood glucose continues to exceed 120 mg/dL, consider Endotool.   In preparation for delivery would consider: -Novolog 0-9 units TID & HS - Metformin 500 mg BID  Thanks, Bronson Curb, MSN, RNC-OB Diabetes Coordinator [phone] (8a-5p)

## 2022-05-19 NOTE — Lactation Note (Signed)
This note was copied from a baby's chart. Lactation Consultation Note  Patient Name: Tammie Thomas [id number] Date: 05/19/2022 Reason for consult: Primapara;1st time breastfeeding;Initial assessment;Late-preterm 34-36.6wks;Breastfeeding assistance Age:34 hours  P1, 36.[redacted] weeks gestation, T2DM on insulin, CHTN on Magnesium  Mother was alert upon arrival to room and her support person was present. Mother attentive with periods of sleepiness. Mother will need reinforcement of teaching.  Infant cried and showed slight feeding cue. Baby to breast in football hold with basic breastfeeding education however baby was too sleepy to latch. Discussed supplementation for LPTI and mother's preference is formula. Taught mother how to pace bottle feed and baby tolerated 6 ml. Mother was then instructed on pumping in the initiation phase to stimulate milk production. The 24 mm flange fit the right breast well and 21 mm flange was best for left breast. Mother will try both for most comfortable fit. Baby was able to breastfeed in PACU.   Discussed LPTI feeding behaviors and instructed parents:  Place baby STS when mother is awake and alert. Father can also do STS when awake and alert. Offer breast often with feeding cues, asking for assistance as needed. Supplement (Neosure 22 kcal) baby according to LPTI feeding guidelines based on hours of age using paced bottle feeding. Pump both breast every 3 hours on the initiation phase. Feed baby EBM as collected.  Mother was given LPTI education sheet and card placed in crib. Lactation to follow.   Maternal Data Has patient been taught Hand Expression?: Yes Does the patient have breastfeeding experience prior to this delivery?: No  Feeding Mother's Current Feeding Choice: Breast Milk and Formula  LATCH Score Latch: Too sleepy or reluctant, no latch achieved, no sucking elicited.  Audible Swallowing: None  Type of Nipple: Everted at rest and after  stimulation  Comfort (Breast/Nipple): Soft / non-tender  Hold (Positioning): Full assist, staff holds infant at breast  LATCH Score: 4   Lactation Tools Discussed/Used Tools: Pump;Flanges Flange Size: 21;24 (24 mm flange fit well on right breast, 21 mm flange on left) Breast pump type: Double-Electric Breast Pump Pump Education: Setup, frequency, and cleaning;Milk Storage Reason for Pumping: LPTI, stimulate milk production Pumping frequency: every 3 hours for 15 minutes  Interventions Interventions: Breast feeding basics reviewed;Assisted with latch;Skin to skin;Hand express;Adjust position;Support pillows;Education;LC Services brochure  Discharge Pump: DEBP;Personal  Consult Status Consult Status: Follow-up Date: 05/20/22 Follow-up type: In-patient    Stana Bunting M 05/19/2022, 8:08 PM

## 2022-05-19 NOTE — Progress Notes (Signed)
OB PN:  S: Pt resting comfortably, no acute complaints  O: BP 130/66   Pulse (!) 106   Temp 98.1 F (36.7 C) (Oral)   Resp 18   Ht 5\' 7"  (1.702 m)   Wt (!) 171.6 kg   LMP 09/07/2021   SpO2 100%   BMI 59.23 kg/m   FHT: 135bpm, moderate variablity, + accels, no decels Toco: q5 min, MVU- 80 SVE: deferred  Results for orders placed or performed during the hospital encounter of 05/17/22 (from the past 24 hour(s))  Glucose, capillary     Status: Abnormal   Collection Time: 05/18/22  1:21 PM  Result Value Ref Range   Glucose-Capillary 118 (H) 70 - 99 mg/dL  Glucose, capillary     Status: Abnormal   Collection Time: 05/18/22  5:39 PM  Result Value Ref Range   Glucose-Capillary 128 (H) 70 - 99 mg/dL  CBC     Status: Abnormal   Collection Time: 05/18/22  7:17 PM  Result Value Ref Range   WBC 11.5 (H) 4.0 - 10.5 K/uL   RBC 4.18 3.87 - 5.11 MIL/uL   Hemoglobin 11.5 (L) 12.0 - 15.0 g/dL   HCT 33.7 (L) 36.0 - 46.0 %   MCV 80.6 80.0 - 100.0 fL   MCH 27.5 26.0 - 34.0 pg   MCHC 34.1 30.0 - 36.0 g/dL   RDW 14.1 11.5 - 15.5 %   Platelets 355 150 - 400 K/uL   nRBC 0.0 0.0 - 0.2 %  Comprehensive metabolic panel     Status: Abnormal   Collection Time: 05/18/22  7:17 PM  Result Value Ref Range   Sodium 132 (L) 135 - 145 mmol/L   Potassium 4.2 3.5 - 5.1 mmol/L   Chloride 102 98 - 111 mmol/L   CO2 17 (L) 22 - 32 mmol/L   Glucose, Bld 116 (H) 70 - 99 mg/dL   BUN 7 6 - 20 mg/dL   Creatinine, Ser 0.89 0.44 - 1.00 mg/dL   Calcium 8.4 (L) 8.9 - 10.3 mg/dL   Total Protein 6.5 6.5 - 8.1 g/dL   Albumin 2.6 (L) 3.5 - 5.0 g/dL   AST 27 15 - 41 U/L   ALT 25 0 - 44 U/L   Alkaline Phosphatase 163 (H) 38 - 126 U/L   Total Bilirubin 0.4 0.3 - 1.2 mg/dL   GFR, Estimated >60 >60 mL/min   Anion gap 13 5 - 15  Magnesium     Status: Abnormal   Collection Time: 05/18/22  7:17 PM  Result Value Ref Range   Magnesium 5.0 (H) 1.7 - 2.4 mg/dL  Glucose, capillary     Status: Abnormal   Collection Time:  05/18/22  8:49 PM  Result Value Ref Range   Glucose-Capillary 120 (H) 70 - 99 mg/dL   Comment 1 Notify RN    Comment 2 Document in Chart   Glucose, capillary     Status: Abnormal   Collection Time: 05/19/22 12:52 AM  Result Value Ref Range   Glucose-Capillary 134 (H) 70 - 99 mg/dL   Comment 1 Notify RN    Comment 2 Document in Chart   Glucose, capillary     Status: Abnormal   Collection Time: 05/19/22  4:57 AM  Result Value Ref Range   Glucose-Capillary 146 (H) 70 - 99 mg/dL   Comment 1 Notify RN    Comment 2 Document in Chart   CBC     Status: Abnormal   Collection Time: 05/19/22  7:04 AM  Result Value Ref Range   WBC 13.0 (H) 4.0 - 10.5 K/uL   RBC 4.07 3.87 - 5.11 MIL/uL   Hemoglobin 11.0 (L) 12.0 - 15.0 g/dL   HCT 33.3 (L) 36.0 - 46.0 %   MCV 81.8 80.0 - 100.0 fL   MCH 27.0 26.0 - 34.0 pg   MCHC 33.0 30.0 - 36.0 g/dL   RDW 14.1 11.5 - 15.5 %   Platelets 340 150 - 400 K/uL   nRBC 0.0 0.0 - 0.2 %  Comprehensive metabolic panel     Status: Abnormal   Collection Time: 05/19/22  7:04 AM  Result Value Ref Range   Sodium 131 (L) 135 - 145 mmol/L   Potassium 4.1 3.5 - 5.1 mmol/L   Chloride 101 98 - 111 mmol/L   CO2 17 (L) 22 - 32 mmol/L   Glucose, Bld 116 (H) 70 - 99 mg/dL   BUN 8 6 - 20 mg/dL   Creatinine, Ser 0.97 0.44 - 1.00 mg/dL   Calcium 8.2 (L) 8.9 - 10.3 mg/dL   Total Protein 6.3 (L) 6.5 - 8.1 g/dL   Albumin 2.5 (L) 3.5 - 5.0 g/dL   AST 26 15 - 41 U/L   ALT 25 0 - 44 U/L   Alkaline Phosphatase 157 (H) 38 - 126 U/L   Total Bilirubin 0.4 0.3 - 1.2 mg/dL   GFR, Estimated >60 >60 mL/min   Anion gap 13 5 - 15  Magnesium     Status: Abnormal   Collection Time: 05/19/22  7:04 AM  Result Value Ref Range   Magnesium 5.6 (H) 1.7 - 2.4 mg/dL  Glucose, capillary     Status: Abnormal   Collection Time: 05/19/22  9:15 AM  Result Value Ref Range   Glucose-Capillary 110 (H) 70 - 99 mg/dL     A/P: 32 y.o. G1P0 @ [redacted]w[redacted]d for IOL due to cHTN with superimposed Preeclampsia  with severe features 1. FWB: Cat. I 2. Labor: IUPC in place for titration of Pit Pain: epidural in place GBS: PCN per protocol  3. CHTN with superimposed Preeclampsia with severe features -continue Mag -remains asymptomatic, BP stable -procardia 60mg  BID -labs stable  4. T2DM -on lantus 15units nightly with SSI -continue q2hr checks -BS stable  DISPO: Pitocin has been restarted this am @ 0430, will plan on recheck around 1130am.   Janyth Pupa, DO Attending [place], [place] for [employer], [place]

## 2022-05-19 NOTE — Transfer of Care (Signed)
Immediate Anesthesia Transfer of Care Note  Patient: Tammie Thomas  Procedure(s) Performed: CESAREAN SECTION (Abdomen)  Patient Location: PACU  Anesthesia Type:Epidural  Level of Consciousness: awake, alert , and oriented  Airway & Oxygen Therapy: Patient Spontanous Breathing  Post-op Assessment: Report given to RN and Post -op Vital signs reviewed and stable  Post vital signs: Reviewed and stable  Last Vitals:  Vitals Value Taken Time  BP 132/83 05/19/22 1532  Temp 36.2 C 05/19/22 1532  Pulse 105 05/19/22 1542  Resp 18 05/19/22 1542  SpO2 99 % 05/19/22 1542  Vitals shown include unvalidated device data.  Last Pain:  Vitals:   05/19/22 1532  TempSrc: Oral  PainSc:          Complications: No notable events documented.

## 2022-05-19 NOTE — Discharge Summary (Shared)
Postpartum Discharge Summary  Patient Name: Tammie Thomas DOB: 1990-06-19 MRN: [id number]  Date of admission: 05/17/2022 Delivery date:05/19/2022  Delivering provider: Clarnce Flock  Date of discharge: 05/22/2022  Admitting diagnosis: Pre-eclampsia, severe, antepartum, third trimester [O14.13] Intrauterine pregnancy: [redacted]w[redacted]d     Secondary diagnosis:  Principal Problem:   Status post primary low transverse cesarean section Active Problems:   Supervision of high risk pregnancy, antepartum   BMI 50.0-59.9, adult ([place])   Maternal morbid obesity, antepartum ([place])   Chronic hypertension affecting pregnancy   Pre-eclampsia, severe, antepartum, third trimester   Postoperative anemia due to acute blood loss  Additional problems: T2DM    Discharge diagnosis: Preterm Pregnancy Delivered, Preeclampsia (severe), CHTN, and Type 2 DM                                              Post partum procedures: N/A Augmentation: AROM, Pitocin, and Cytotec Complications: Uterine [facility] course: Induction of Labor With Cesarean Section   33 y.o. yo G1P0101 at [redacted]w[redacted]d was admitted to the hospital 05/17/2022 for induction of labor. Patient had a labor course significant for no cervical change despite augmentation . The patient went for cesarean section due to Arrest of Dilation. Delivery details are as follows: Membrane Rupture Time/Date: 1:52 PM ,05/18/2022   Delivery Method:C-Section, Low Transverse  Details of operation can be found in separate operative Note.  Patient had an uncomplicated postpartum course. She is ambulating, tolerating a regular diet, passing flatus, and urinating well.  Patient is discharged home in stable condition on 05/22/22.      Newborn Data: Birth date:05/19/2022  Birth time:2:11 PM  Gender:Female  Living status:Living  Apgars:8 ,8  Weight:3700 g                                Magnesium Sulfate received: Yes: Seizure prophylaxis BMZ received:  No Rhophylac:N/A MMR:N/A T-DaP:Given prenatally Flu: No Transfusion:No  Physical exam  Vitals:   05/21/22 1557 05/21/22 1943 05/22/22 0005 05/22/22 0743  BP: (!) 100/49 (!) 141/76 131/73 (!) 147/86  Pulse: (!) 126 (!) 111 97 99  Resp: 20 18 20 19   Temp: 98 F (36.7 C) 97.8 F (36.6 C) 98 F (36.7 C) 98.3 F (36.8 C)  TempSrc: Oral Oral Oral Oral  SpO2: 99% 99% 98% 100%  Weight:      Height:       General: alert, cooperative, and no distress Lochia: appropriate Uterine Fundus: firm Incision: Prevena dressing in place DVT Evaluation: No evidence of DVT seen on physical exam. Labs: Lab Results  Component Value Date   WBC 15.8 (H) 05/20/2022   HGB 9.5 (L) 05/20/2022   HCT 28.5 (L) 05/20/2022   MCV 83.1 05/20/2022   PLT 283 05/20/2022      Latest Ref Rng & Units 05/19/2022    6:51 PM  CMP  Glucose 70 - 99 mg/dL 186   BUN 6 - 20 mg/dL 7   Creatinine 0.44 - 1.00 mg/dL 0.94   Sodium 135 - 145 mmol/L 134   Potassium 3.5 - 5.1 mmol/L 4.7   Chloride 98 - 111 mmol/L 106   CO2 22 - 32 mmol/L 20   Calcium 8.9 - 10.3 mg/dL 8.0   Total Protein 6.5 - 8.1 g/dL 6.1   Total Bilirubin 0.3 -  1.2 mg/dL 0.7   Alkaline Phos 38 - 126 U/L 162   AST 15 - 41 U/L 40   ALT 0 - 44 U/L 23    Edinburgh Score:    05/20/2022    6:05 PM  Edinburgh Postnatal Depression Scale Screening Tool  I have been able to laugh and see the funny side of things. 0  I have looked forward with enjoyment to things. 0  I have blamed myself unnecessarily when things went wrong. 0  I have been anxious or worried for no good reason. 1  I have felt scared or panicky for no good reason. 0  Things have been getting on top of me. 1  I have been so unhappy that I have had difficulty sleeping. 0  I have felt sad or miserable. 1  I have been so unhappy that I have been crying. 0  The thought of harming myself has occurred to me. 0  Edinburgh Postnatal Depression Scale Total 3     After visit meds:  Allergies  as of 05/22/2022   No Known Allergies      Medication List     STOP taking these medications    aspirin EC 81 MG tablet   citalopram 10 MG tablet Commonly known as: CeleXA   Levemir FlexTouch 100 UNIT/ML FlexPen Generic drug: insulin detemir       TAKE these medications    Accu-Chek Softclix Lancets lancets 1 each by Other route 4 (four) times daily.   acetaminophen 500 MG tablet Commonly known as: TYLENOL Take 2 tablets (1,000 mg total) by mouth every 6 (six) hours.   blood glucose meter kit and supplies Kit Dispense based on patient and insurance preference. Use up to four times daily as directed.   furosemide 20 MG tablet Commonly known as: LASIX Take 1 tablet (20 mg total) by mouth daily.   glucose blood test strip Use as instructed   ibuprofen 600 MG tablet Commonly known as: ADVIL Take 1 tablet (600 mg total) by mouth every 6 (six) hours.   insulin glargine 100 UNIT/ML Solostar Pen Commonly known as: LANTUS Inject 15 Units into the skin at bedtime.   Insulin Pen Needle 33G X 5 MM Misc 1 Units by Does not apply route 5 (five) times daily.   NIFEdipine 60 MG 24 hr tablet Commonly known as: ADALAT CC Take 1 tablet (60 mg total) by mouth 2 (two) times daily.   oxyCODONE 5 MG immediate release tablet Commonly known as: Oxy IR/ROXICODONE Take 1 tablet (5 mg total) by mouth every 4 (four) hours as needed for moderate pain.   pantoprazole 20 MG tablet Commonly known as: Protonix Take 1 tablet (20 mg total) by mouth daily.   prenatal multivitamin Tabs tablet Take 1 tablet by mouth daily at 12 noon.   PRENATAL VITAMINS PO Take by mouth.   senna-docusate 8.6-50 MG tablet Commonly known as: Senokot-S Take 2 tablets by mouth daily.               Discharge Care Instructions  (From admission, onward)           Start     Ordered   05/22/22 0000  Leave dressing on - Keep it clean, dry, and intact until clinic visit        05/22/22 0858            Discharge home in stable condition Infant Feeding: Breast Infant Disposition:home with mother Discharge instruction: per After Visit Summary  and Postpartum booklet. Activity: Advance as tolerated. Pelvic rest for 6 weeks.  Diet: routine diet Future Appointments: Future Appointments  Date Time Provider [place]  05/29/2022  3:10 PM Truett Mainland, DO [facility] None  07/10/2022  2:50 PM Nehemiah Settle Tanna Savoy, DO [facility] None   Follow up Visit:  [place] Follow up in 1 week(s).   Specialty: Obstetrics and Gynecology Why: For incision check Contact information: [place] [phone]               Message sent to [facility] 1/29  Please schedule this patient for a Virtual postpartum visit in 6 weeks with the following provider: Any provider. Additional Postpartum F/U:Postpartum Depression checkup, 2 hour GTT, Incision check 1 week, and BP check 1 week  High risk pregnancy complicated by: HTN and Q6VH Delivery mode:  C-Section, Low Transverse  Anticipated Birth Control:  IUD   05/22/2022 Inez Catalina, MD

## 2022-05-19 NOTE — Plan of Care (Signed)
  Problem: Education: °Goal: Knowledge of disease or condition will improve °Outcome: Completed/Met °Goal: Knowledge of the prescribed therapeutic regimen will improve °Outcome: Completed/Met °Goal: Individualized Educational Video(s) °Outcome: Completed/Met °  °Problem: Clinical Measurements: °Goal: Complications related to the disease process, condition or treatment will be avoided or minimized °Outcome: Completed/Met °  °

## 2022-05-19 NOTE — Progress Notes (Signed)
Labor Progress Note Tammie Thomas is a 32 y.o. G1P0 at [redacted]w[redacted]d presented for IOL due to cHTN with SIPE with severe fxs S: doing well. Now has an epidural and is comfortable. Feels pressure from contractions.  O:  BP (!) 144/67   Pulse (!) 109   Temp 98.1 F (36.7 C) (Oral)   Resp 16   Ht 5\' 7"  (1.702 m)   Wt (!) 171.6 kg   LMP 09/07/2021   SpO2 100%   BMI 59.23 kg/m   EFM: 140bpm, moderate, +accels, no decels  Contractions: every 3 -4 mins  CVE: Dilation: 5 Effacement (%): 80 Cervical Position:  (pt's right) Station: -1 Presentation: Vertex Exam by:: Lasean Gorniak   A&P: 32 y.o. G1P0 [redacted]w[redacted]d IOL for cHTN w SIPE w SF.  #Labor: s/p cytotec X1 + FB + AROM.  Slow progress, on pit at 32 units. IUPC placed to aid pitocin titration. Goal is ~200MVU in 10 mins.  - if still inadequate at 4.30am (24hrs of pitocin), plan for a 1hr pitocin break and restart at 1/2 Recheck in 4hrs, sooner if indicated. #Pain: IV fentanyl, epidural per request when active #FWB: Cat 1 #GBS pending, PCN given and adequately treated.  cHTN w SIPE w SF - blood pressures stable, - on Mag sulf. Labs are stable. - continue procardia 60 BID - continue IOL  T2DM: - BS stable - continue lantus 15 units nightly, with SSI. - q4hr BS check, and then q2hrs in active labor.  Zainah Steven Sherrilyn Rist, MD 3:04 AM

## 2022-05-19 NOTE — Progress Notes (Signed)
At bedside to review management plan.  Pt reports not feeling great and is concerned that she is not making any change.  Reviewed management options.    SVE: completed- no change remains 4-5/80/-1  Discussed plan for C-section due to arrest of dilation.  Risk benefits and alternatives of cesarean section were discussed with the patient including but not limited to infection, bleeding, damage to bowel , bladder and baby with the need for further surgery.  Questions and concerns were addressed and desires to proceed.  Informed consent obtained.  Dr. Dione Plover and OR notified.  Janyth Pupa, DO Attending [place], [facility] for [employer], [place]

## 2022-05-19 NOTE — Progress Notes (Signed)
OB PN:  S: Notes mild headache, feeling pressure in her head similar to sinus pressure.  No change in vision.  No RUQ pain.  O: BP 130/87   Pulse (!) 126   Temp (!) 97.1 F (36.2 C) (Axillary)   Resp 20   Ht 5\' 7"  (1.702 m)   Wt (!) 171.6 kg   LMP 09/07/2021   SpO2 100%   BMI 59.23 kg/m   FHT: 135bpm, moderate variablity, + accels, no decels Toco: q5 min, MVU- 100 SVE: 5/80/-1- unchanged from previously  Results for orders placed or performed during the hospital encounter of 05/17/22 (from the past 24 hour(s))  Glucose, capillary     Status: Abnormal   Collection Time: 05/18/22  1:21 PM  Result Value Ref Range   Glucose-Capillary 118 (H) 70 - 99 mg/dL  Glucose, capillary     Status: Abnormal   Collection Time: 05/18/22  5:39 PM  Result Value Ref Range   Glucose-Capillary 128 (H) 70 - 99 mg/dL  CBC     Status: Abnormal   Collection Time: 05/18/22  7:17 PM  Result Value Ref Range   WBC 11.5 (H) 4.0 - 10.5 K/uL   RBC 4.18 3.87 - 5.11 MIL/uL   Hemoglobin 11.5 (L) 12.0 - 15.0 g/dL   HCT 33.7 (L) 36.0 - 46.0 %   MCV 80.6 80.0 - 100.0 fL   MCH 27.5 26.0 - 34.0 pg   MCHC 34.1 30.0 - 36.0 g/dL   RDW 14.1 11.5 - 15.5 %   Platelets 355 150 - 400 K/uL   nRBC 0.0 0.0 - 0.2 %  Comprehensive metabolic panel     Status: Abnormal   Collection Time: 05/18/22  7:17 PM  Result Value Ref Range   Sodium 132 (L) 135 - 145 mmol/L   Potassium 4.2 3.5 - 5.1 mmol/L   Chloride 102 98 - 111 mmol/L   CO2 17 (L) 22 - 32 mmol/L   Glucose, Bld 116 (H) 70 - 99 mg/dL   BUN 7 6 - 20 mg/dL   Creatinine, Ser 0.89 0.44 - 1.00 mg/dL   Calcium 8.4 (L) 8.9 - 10.3 mg/dL   Total Protein 6.5 6.5 - 8.1 g/dL   Albumin 2.6 (L) 3.5 - 5.0 g/dL   AST 27 15 - 41 U/L   ALT 25 0 - 44 U/L   Alkaline Phosphatase 163 (H) 38 - 126 U/L   Total Bilirubin 0.4 0.3 - 1.2 mg/dL   GFR, Estimated >60 >60 mL/min   Anion gap 13 5 - 15  Magnesium     Status: Abnormal   Collection Time: 05/18/22  7:17 PM  Result Value Ref  Range   Magnesium 5.0 (H) 1.7 - 2.4 mg/dL  Glucose, capillary     Status: Abnormal   Collection Time: 05/18/22  8:49 PM  Result Value Ref Range   Glucose-Capillary 120 (H) 70 - 99 mg/dL   Comment 1 Notify RN    Comment 2 Document in Chart   Glucose, capillary     Status: Abnormal   Collection Time: 05/19/22 12:52 AM  Result Value Ref Range   Glucose-Capillary 134 (H) 70 - 99 mg/dL   Comment 1 Notify RN    Comment 2 Document in Chart   Glucose, capillary     Status: Abnormal   Collection Time: 05/19/22  4:57 AM  Result Value Ref Range   Glucose-Capillary 146 (H) 70 - 99 mg/dL   Comment 1 Notify RN  Comment 2 Document in Chart   CBC     Status: Abnormal   Collection Time: 05/19/22  7:04 AM  Result Value Ref Range   WBC 13.0 (H) 4.0 - 10.5 K/uL   RBC 4.07 3.87 - 5.11 MIL/uL   Hemoglobin 11.0 (L) 12.0 - 15.0 g/dL   HCT 33.3 (L) 36.0 - 46.0 %   MCV 81.8 80.0 - 100.0 fL   MCH 27.0 26.0 - 34.0 pg   MCHC 33.0 30.0 - 36.0 g/dL   RDW 14.1 11.5 - 15.5 %   Platelets 340 150 - 400 K/uL   nRBC 0.0 0.0 - 0.2 %  Comprehensive metabolic panel     Status: Abnormal   Collection Time: 05/19/22  7:04 AM  Result Value Ref Range   Sodium 131 (L) 135 - 145 mmol/L   Potassium 4.1 3.5 - 5.1 mmol/L   Chloride 101 98 - 111 mmol/L   CO2 17 (L) 22 - 32 mmol/L   Glucose, Bld 116 (H) 70 - 99 mg/dL   BUN 8 6 - 20 mg/dL   Creatinine, Ser 0.97 0.44 - 1.00 mg/dL   Calcium 8.2 (L) 8.9 - 10.3 mg/dL   Total Protein 6.3 (L) 6.5 - 8.1 g/dL   Albumin 2.5 (L) 3.5 - 5.0 g/dL   AST 26 15 - 41 U/L   ALT 25 0 - 44 U/L   Alkaline Phosphatase 157 (H) 38 - 126 U/L   Total Bilirubin 0.4 0.3 - 1.2 mg/dL   GFR, Estimated >60 >60 mL/min   Anion gap 13 5 - 15  Magnesium     Status: Abnormal   Collection Time: 05/19/22  7:04 AM  Result Value Ref Range   Magnesium 5.6 (H) 1.7 - 2.4 mg/dL  Glucose, capillary     Status: Abnormal   Collection Time: 05/19/22  9:15 AM  Result Value Ref Range   Glucose-Capillary 110  (H) 70 - 99 mg/dL     A/P: 32 y.o. G1P0 @ [redacted]w[redacted]d for IOL due to cHTN with superimposed Preeclampsia with severe features 1. FWB: Cat. I 2. Labor: discussed management at this time.  Reviewed concern for arrest of dilation.  IUPC in place, MVU inadequate, Pit currently at 37u.  Currently has been unchanged for over 12 hours.  Ruptured since 1pm yesterday (1/28).  Reviewed risk/benefit of continued labor vs primary C-section.  For now, desires to continue laboring with plan for recheck in 2hrs.  Pain: epidural in place GBS: PCN per protocol  3. CHTN with superimposed Preeclampsia with severe features -continue Mag -tylenol for headache, will continue to monitor -BP stable -procardia 60mg  BID -labs stable  4. T2DM -on lantus 15units nightly with SSI -continue q2hr checks -BS stable  Janyth Pupa, DO Attending [place], [place] for [employer], [place]

## 2022-05-19 NOTE — Op Note (Cosign Needed)
Tammie Thomas PROCEDURE DATE: 05/19/2022  PREOPERATIVE DIAGNOSES: Intrauterine pregnancy at [redacted]w[redacted]d weeks gestation; failure to progress: arrest of descent  POSTOPERATIVE DIAGNOSES: The same, viable infant delivered  PROCEDURE: Primary Low Transverse Cesarean Section  SURGEON:  Dr. Clayton Lefort  ASSISTANT:  Shelda Pal, DO An experienced assistant was required given the standard of surgical care given the complexity of the case.  This assistant was needed for exposure, dissection, suctioning, retraction, instrument exchange, assisting with delivery with administration of fundal pressure, and for overall help during the procedure.  ANESTHESIOLOGY TEAM: Anesthesiologist: Lyn Hollingshead, MD; Gloris Manchester, March Rummage, DO CRNA: Flossie Dibble, CRNA  INDICATIONS: Tammie Thomas is a 32 y.o. G1P0101 at [redacted]w[redacted]d here for cesarean section secondary to the indications listed under preoperative diagnoses; please see preoperative note for further details.  The risks of surgery were discussed with the patient including but were not limited to: bleeding which may require transfusion or reoperation; infection which may require antibiotics; injury to bowel, bladder, ureters or other surrounding organs; injury to the fetus; need for additional procedures including hysterectomy in the event of a life-threatening hemorrhage; formation of adhesions; placental abnormalities wth subsequent pregnancies; incisional problems; thromboembolic phenomenon and other postoperative/anesthesia complications.  The patient concurred with the proposed plan, giving informed written consent for the procedure.    FINDINGS:  Viable female infant in cephalic presentation.  Apgars 8 and 8.  Amniotic fluid: clear.  Intact placenta, three vessel cord.  Normal uterus, fallopian tubes and ovaries bilaterally.  ANESTHESIA: epidural, Chloroprocaine 94ml INTRAVENOUS FLUIDS: 700 ml   ESTIMATED BLOOD LOSS: 671 ml URINE OUTPUT:  50  ml SPECIMENS: Placenta sent to L&D . COMPLICATIONS: None immediate  PROCEDURE IN DETAIL:  The patient preoperatively received intravenous antibiotics and had sequential compression devices applied to her lower extremities.  She was then taken to the operating room where the epidural was dosed up to a surgical level and found to be adequate. She was then placed in a dorsal supine position with a leftward tilt, and prepped and draped in a sterile manner.  A foley catheter was  was already in place.  After an adequate timeout was performed, a Pfannenstiel skin incision was made with scalpel and carried through to the underlying layer of fascia. The fascia was incised in the midline, and this incision was extended bluntly. The rectus muscles were separated in the midline and the peritoneum was entered bluntly. At this time, pt noted pain during peritoneal manipulation, so Chloroprocaine 46ml was administered onto peritoneal space.  The Alexis self-retaining retractor was introduced into the abdominal cavity.  Attention was turned to the lower uterine segment where a low transverse hysterotomy was made with a scalpel and extended bluntly in caudad and cephalad directions.  The infant was successfully delivered, the cord was clamped and cut after one minute, and the infant was handed over to the awaiting neonatology team. Uterine massage was then administered, and the placenta delivered intact with a three-vessel cord. The uterus was then exteriorized and cleared of clots and debris.  The hysterotomy was closed with 0-Monocryl in a running fashion. The uterus remained boggy despite uterine massage and pitocin, so TXA, methergine was administered. Bogginess was still noted, so hemabate was injected directly onto the uterine fundus. Noting that prior interventions were not successful, a JADA device was inserted, cervical balloon filled to 80cc, and connected to suction. Improved uterine tonicity was noted, however,  there was bogginess below the uterine fundus. Finally, methergine and hemabate were administered  once more.  The pelvis was cleared of all clot and debris. Hemostasis was confirmed on all surfaces.  The uterus was returned to the abdomen and the incision was once again inspected and found to be hemostatic. The retractor was removed.  The peritoneum was closed with a 2-0 Vicryl running stitch. The fascia was then closed using 0 Vicryl in a running fashion.  The subcutaneous layer was irrigated, any areas of bleeding were cauterized with the bovie,  was reapproximated with 2-0 plain gut in a running fashion, was found to be hemostatic.The skin was closed with a 4-0 Vicryl subcuticular stitch. The patient tolerated the procedure well. Sponge, instrument and needle counts were correct x 3.  She was taken to the recovery room in stable condition.   Shelda Pal, [place] Fellow, Faculty practice [place] for [facility] 05/19/22  3:30 PM

## 2022-05-20 DIAGNOSIS — D62 Acute posthemorrhagic anemia: Secondary | ICD-10-CM | POA: Insufficient documentation

## 2022-05-20 LAB — GLUCOSE, CAPILLARY
Glucose-Capillary: 98 mg/dL (ref 70–99)
Glucose-Capillary: 87 mg/dL (ref 70–99)
Glucose-Capillary: 105 mg/dL — ABNORMAL HIGH (ref 70–99)
Glucose-Capillary: 109 mg/dL — ABNORMAL HIGH (ref 70–99)
Glucose-Capillary: 171 mg/dL — ABNORMAL HIGH (ref 70–99)

## 2022-05-20 LAB — CBC
HCT: 28.5 % — ABNORMAL LOW (ref 36.0–46.0)
Hemoglobin: 9.5 g/dL — ABNORMAL LOW (ref 12.0–15.0)
MCH: 27.7 pg (ref 26.0–34.0)
MCHC: 33.3 g/dL (ref 30.0–36.0)
MCV: 83.1 fL (ref 80.0–100.0)
Platelets: 283 10*3/uL (ref 150–400)
RBC: 3.43 MIL/uL — ABNORMAL LOW (ref 3.87–5.11)
RDW: 14.1 % (ref 11.5–15.5)
WBC: 15.8 10*3/uL — ABNORMAL HIGH (ref 4.0–10.5)
nRBC: 0 % (ref 0.0–0.2)

## 2022-05-20 MED ORDER — HYDROCORTISONE 1 % EX CREA
TOPICAL_CREAM | Freq: Three times a day (TID) | CUTANEOUS | Status: DC | PRN
  Administered 2022-05-20: 1 via TOPICAL
  Filled 2022-05-20: qty 28

## 2022-05-20 MED ORDER — SODIUM CHLORIDE 0.9 % IV SOLN
500.0000 mg | Freq: Once | INTRAVENOUS | Status: AC
  Administered 2022-05-20: 500 mg via INTRAVENOUS
  Filled 2022-05-20: qty 460

## 2022-05-20 MED ORDER — METFORMIN HCL 500 MG PO TABS: 500.0000 mg | ORAL_TABLET | Freq: Two times a day (BID) | ORAL | Status: DC

## 2022-05-20 NOTE — Lactation Note (Signed)
This note was copied from a baby's chart. Lactation Consultation Note  Patient Name: Tammie Thomas [id number] Date: 05/20/2022   Age:34 hours Attempted to see mom, mom in [place].  Maternal Data    Feeding Nipple Type: Extra Slow Flow  LATCH Score                    Lactation Tools Discussed/Used    Interventions    Discharge    Consult Status      Theodoro Kalata 05/20/2022, 10:32 PM

## 2022-05-20 NOTE — Lactation Note (Signed)
This note was copied from a baby's chart. Lactation Consultation Note  Patient Name: Tammie Thomas [id number] Date: 05/20/2022 Reason for consult: Follow-up assessment;Late-preterm 34-36.6wks;Maternal endocrine disorder;Primapara Age:34 hours When LC went back to see mom , mom was sitting on side of bed pumping w/DEBP. Praised mom. Mom stated she had only pumped once yesterday and this was first time today. Encouraged mom to pump every three hours and at least once during the night until the baby would be full term. Explained baby doesn't stimulate the breast like full term. But baby isn't going to the breast either. Mom stated she is doing good to get baby to take bottle. Reminded of amount if not going to the breast needs to be more since no other nutrition given. Mom stated understanding. Encouraged mom to call for latch assistance when she is going to BF next that Lactation would love to help her. Mom thanked [name]. Maternal Data Does the patient have breastfeeding experience prior to this delivery?: No  Feeding Mother's Current Feeding Choice: Breast Milk and Formula Nipple Type: Extra Slow Flow  LATCH Score                    Lactation Tools Discussed/Used Tools: Pump Flange Size: 27 Breast pump type: Double-Electric Breast Pump Reason for Pumping: LPI  Interventions Interventions: DEBP;Breast feeding basics reviewed  Discharge    Consult Status Consult Status: Follow-up Date: 05/21/22 Follow-up type: In-patient    Theodoro Kalata 05/20/2022, 10:45 PM

## 2022-05-20 NOTE — Plan of Care (Signed)
  Problem: Education: Goal: Knowledge of disease or condition will improve Outcome: Completed/Met Goal: Knowledge of the prescribed therapeutic regimen will improve Outcome: Completed/Met   Problem: Activity: Goal: Risk for activity intolerance will decrease Outcome: Completed/Met   Problem: Nutrition: Goal: Adequate nutrition will be maintained Outcome: Completed/Met   Problem: Coping: Goal: Level of anxiety will decrease Outcome: Completed/Met   Problem: Coping: Goal: Ability to adjust to condition or change in health will improve Outcome: Completed/Met   Problem: Nutritional: Goal: Maintenance of adequate nutrition will improve Outcome: Completed/Met   Problem: Education: Goal: Knowledge of disease or condition will improve Outcome: Completed/Met Goal: Knowledge of the prescribed therapeutic regimen will improve Outcome: Completed/Met   Problem: Education: Goal: Knowledge of the prescribed therapeutic regimen will improve Outcome: Completed/Met   Problem: Education: Goal: Knowledge of condition will improve Outcome: Completed/Met   Problem: Activity: Goal: Will verbalize the importance of balancing activity with adequate rest periods Outcome: Completed/Met Goal: Ability to tolerate increased activity will improve Outcome: Completed/Met   Problem: Coping: Goal: Ability to identify and utilize available resources and services will improve Outcome: Completed/Met

## 2022-05-20 NOTE — Progress Notes (Signed)
Postpartum Day 1: Cesarean Delivery at [redacted]w[redacted]d after IOL for severe preeclampsia superimposed on CHTN, T2DM, BMI 60  Subjective:  Patient reports tolerating PO and no problems voiding.  No flatus yet. Breastfeeding, baby stable at bedside. Ambulating . Patient denies any headaches, visual symptoms, RUQ/epigastric pain or other concerning symptoms.  Objective: Vital signs in last 24 hours: Temp:  [97.1 F (36.2 C)-98.6 F (37 C)] 97.5 F (36.4 C) (01/30 0338) Pulse Rate:  [91-126] 96 (01/30 0338) Resp:  [14-30] 16 (01/30 0700) BP: (110-153)/(63-100) 148/82 (01/30 0338) SpO2:  [97 %-100 %] 100 % (01/30 0338) I/O last 3 completed shifts: In: 7866.7 [P.O.:3548; I.V.:3968.7; IV Piggyback:350] Out: 14782 [Urine:9175; Blood:872]  Physical Exam:  General: alert and no distress Lochia: appropriate Uterine Fundus: firm Incision: Pressure dressing in place DVT Evaluation: No evidence of DVT seen on physical exam. Negative Homan's sign. No cords or calf tenderness.  Labs:    Latest Ref Rng & Units 05/20/2022    5:27 AM 05/19/2022    6:51 PM 05/19/2022    4:25 PM  CBC  WBC 4.0 - 10.5 K/uL 15.8  16.8  15.0   Hemoglobin 12.0 - 15.0 g/dL 9.5  10.7  11.8   Hematocrit 36.0 - 46.0 % 28.5  32.0  34.6   Platelets 150 - 400 K/uL 283  331  328    CBG (last 3)  Recent Labs    05/19/22 1549 05/19/22 2159 05/20/22 0604  GLUCAP 132* 172* 98     Assessment/Plan: Status post Cesarean section. Doing well postoperatively.  -Continue magnesium sulfate until 24 hours postpartum -Continue Procardia 60 mg po bid and Lasix 20 mg po bid for HTN. Titrate accordingly to manage BP. - Counseled about IV iron treatment for postoperative anemia due to acute blood loss, she agreed to this. Venofer ordered. - Continue to monitor CBGs, consider Metformin if elevated. - Encourage OOB, ambulation - Breastfeeding, desires OP IUD for contraception - Continue current postpartum care.  Verita Schneiders,  MD 05/20/2022, 7:19 AM

## 2022-05-20 NOTE — Anesthesia Postprocedure Evaluation (Signed)
Anesthesia Post Note  Patient: Tammie Thomas  Procedure(s) Performed: CESAREAN SECTION (Abdomen)     Patient location during evaluation: Mother Baby Anesthesia Type: Epidural Level of consciousness: awake and alert Pain management: pain level controlled Vital Signs Assessment: post-procedure vital signs reviewed and stable Respiratory status: spontaneous breathing, nonlabored ventilation and respiratory function stable Cardiovascular status: stable Postop Assessment: no headache, no backache and epidural receding Anesthetic complications: no   No notable events documented.  Last Vitals:  Vitals:   05/20/22 0700 05/20/22 0807  BP:  (!) 141/76  Pulse:  91  Resp: 16 18  Temp:  36.6 C  SpO2:  100%    Last Pain:  Vitals:   05/20/22 0807  TempSrc: Oral  PainSc:    Pain Goal: Patients Stated Pain Goal: 3 (05/19/22 1737)                 Barkley Boards

## 2022-05-21 LAB — GLUCOSE, CAPILLARY
Glucose-Capillary: 108 mg/dL — ABNORMAL HIGH (ref 70–99)
Glucose-Capillary: 88 mg/dL (ref 70–99)
Glucose-Capillary: 75 mg/dL (ref 70–99)
Glucose-Capillary: 97 mg/dL (ref 70–99)

## 2022-05-21 NOTE — Anesthesia Postprocedure Evaluation (Signed)
Anesthesia Post Note  Patient: Tammie Thomas  Procedure(s) Performed: CESAREAN SECTION (Abdomen)     Patient location during evaluation: PACU Anesthesia Type: Epidural Level of consciousness: awake Pain management: pain level controlled Vital Signs Assessment: post-procedure vital signs reviewed and stable Respiratory status: spontaneous breathing Cardiovascular status: stable Postop Assessment: no apparent nausea or vomiting Anesthetic complications: no  No notable events documented.  Last Vitals:  Vitals:   05/21/22 0328 05/21/22 0757  BP: 127/72 124/81  Pulse: (!) 101 92  Resp: 20 14  Temp: 36.8 C 36.4 C  SpO2: 98% 100%    Last Pain:  Vitals:   05/21/22 0800  TempSrc:   PainSc: 0-No pain                 Huston Foley

## 2022-05-21 NOTE — Lactation Note (Signed)
This note was copied from a baby's chart. Lactation Consultation Note  Patient Name: Tammie Thomas [id number] Date: 05/21/2022 Reason for consult: Follow-up assessment;Late-preterm 34-36.6wks;Maternal endocrine disorder;1st time breastfeeding;Primapara Age:34 hours  Visited with family of 63 hours old LPI female; Tammie Thomas is a P1 and reports she's pumping but not consistently. Explained the importance of nipple stimulation/consistent pumping for the onset of lactogenesis II. Assisted with hand expression (no colostrum noted at this time). She hasn't put baby to breast since birth, practitioner wanted to observe a latch. Offered breastfeeding assistance but Tammie Thomas politely declined baby wasn't ready to eat at this time, she was still spitting up the last formula feeding she had; it was 34 ml. Asked her to call for latch assistance when needed, she's expecting to be discharged tomorrow.   Feeding Mother's Current Feeding Choice: Breast Milk and Formula Nipple Type: Dr. Myra Gianotti Preemie  Lactation Tools Discussed/Used Tools: Pump;Flanges Flange Size: 27 Breast pump type: Double-Electric Breast Pump Pump Education: Setup, frequency, and cleaning;Milk Storage Reason for Pumping: LPI Pumping frequency: 2 times/24 hours Pumped volume: 0 mL  Interventions Interventions: Breast feeding basics reviewed;DEBP;Education;LPT handout/interventions  Plan of care Encouraged to put baby to breast on feeding cues, at least +8 times/24 hours She'll pump whenever baby is getting a bottle  She'll continue supplementing with Similac 22 calorie formula every 3 hours @ > 28 ml per LPI guidelines  No other support person at this time. All questions and concerns answered, family to contact [facility] services PRN.  Consult Status Consult Status: Follow-up Date: 05/21/22 Follow-up type: In-patient   Tammie Thomas 05/21/2022, 5:12 PM

## 2022-05-21 NOTE — Progress Notes (Signed)
Subjective: Postpartum Day 2: Cesarean Delivery Patient reports feeling well with some incisional pain. She is ambulating, tolerating a regular diet and voiding. She denies any other complaints.    Objective: Vital signs in last 24 hours: Temp:  [97.6 F (36.4 C)-98.3 F (36.8 C)] 97.6 F (36.4 C) (01/31 0757) Pulse Rate:  [90-113] 92 (01/31 0757) Resp:  [14-20] 14 (01/31 0757) BP: (102-141)/(50-93) 124/81 (01/31 0757) SpO2:  [98 %-100 %] 100 % (01/31 0757)  Physical Exam:  General: alert, cooperative, and no distress Lochia: appropriate Uterine Fundus: firm Incision: pressure dressing removed. Wound vac in place and intact DVT Evaluation: No evidence of DVT seen on physical exam.  Recent Labs    05/19/22 1851 05/20/22 0527  HGB 10.7* 9.5*  HCT 32.0* 28.5*    Assessment/Plan: Status post Cesarean section. Doing well postoperatively.  Encourage ambulation BP well controlled Patient admits to snacking overnight - will continue with insulin at current dose Continue current care with plans for discharge home tomorrow  Mora Bellman, MD 05/21/2022, 9:12 AM

## 2022-05-21 NOTE — Lactation Note (Signed)
This note was copied from a baby's chart. Lactation Consultation Note  Patient Name: Tammie Thomas [id number] Date: 05/21/2022   Age:34 hours  LC in to see mother at providers request to assist and observe breastfeeding.Infant recently fed, mother is currently busy with phone calls regarding maternity leave, states she hasn't eaten and requested for an evening consult once things have settled. Will return.  Tammie Thomas M 05/21/2022, 11:39 AM

## 2022-05-22 ENCOUNTER — Encounter: Payer: Commercial Managed Care - HMO | Admitting: Family Medicine

## 2022-05-22 ENCOUNTER — Other Ambulatory Visit (HOSPITAL_COMMUNITY): Payer: Self-pay

## 2022-05-22 LAB — GLUCOSE, CAPILLARY
Glucose-Capillary: 76 mg/dL (ref 70–99)
Glucose-Capillary: 120 mg/dL — ABNORMAL HIGH (ref 70–99)

## 2022-05-22 MED ORDER — SENNOSIDES-DOCUSATE SODIUM 8.6-50 MG PO TABS
2.0000 | ORAL_TABLET | Freq: Every day | ORAL | 0 refills | Status: DC
  Filled 2022-05-22: qty 30, 15d supply, fill #0

## 2022-05-22 MED ORDER — IBUPROFEN 600 MG PO TABS
600.0000 mg | ORAL_TABLET | Freq: Four times a day (QID) | ORAL | 0 refills | Status: DC
  Filled 2022-05-22: qty 30, 8d supply, fill #0

## 2022-05-22 MED ORDER — FUROSEMIDE 20 MG PO TABS
20.0000 mg | ORAL_TABLET | Freq: Every day | ORAL | 0 refills | Status: DC
  Filled 2022-05-22: qty 5, 5d supply, fill #0

## 2022-05-22 MED ORDER — INSULIN GLARGINE 100 UNIT/ML SOLOSTAR PEN
15.0000 [IU] | PEN_INJECTOR | Freq: Every day | SUBCUTANEOUS | 11 refills | Status: DC
  Filled 2022-05-22: qty 6, 40d supply, fill #0

## 2022-05-22 MED ORDER — PRENATAL MULTIVITAMIN CH
1.0000 | ORAL_TABLET | Freq: Every day | ORAL | 2 refills | Status: DC
  Filled 2022-05-22: qty 30, 30d supply, fill #0

## 2022-05-22 MED ORDER — ACETAMINOPHEN 500 MG PO TABS
1000.0000 mg | ORAL_TABLET | Freq: Four times a day (QID) | ORAL | 0 refills | Status: DC
  Filled 2022-05-22: qty 30, 4d supply, fill #0

## 2022-05-22 MED ORDER — OXYCODONE HCL 5 MG PO TABS
5.0000 mg | ORAL_TABLET | ORAL | 0 refills | Status: DC | PRN
  Filled 2022-05-22: qty 12, 2d supply, fill #0

## 2022-05-22 NOTE — Plan of Care (Signed)
  Problem: Elimination: Goal: Will not experience complications related to urinary retention Outcome: Completed/Met   Problem: Pain Managment: Goal: General experience of comfort will improve Outcome: Completed/Met   Problem: Nutritional: Goal: Progress toward achieving an optimal weight will improve Outcome: Completed/Met   Problem: Education: Goal: Understanding of sexual limitations or changes related to disease process or condition will improve Outcome: Completed/Met   Problem: Role Relationship: Goal: Ability to demonstrate positive interaction with newborn will improve Outcome: Completed/Met

## 2022-05-23 ENCOUNTER — Ambulatory Visit: Payer: Commercial Managed Care - HMO

## 2022-05-26 ENCOUNTER — Inpatient Hospital Stay (HOSPITAL_COMMUNITY)
Admission: RE | Admit: 2022-05-26 | Payer: Commercial Managed Care - HMO | Source: Home / Self Care | Admitting: Family Medicine

## 2022-05-26 ENCOUNTER — Inpatient Hospital Stay (HOSPITAL_COMMUNITY): Payer: Commercial Managed Care - HMO

## 2022-05-29 ENCOUNTER — Ambulatory Visit (INDEPENDENT_AMBULATORY_CARE_PROVIDER_SITE_OTHER): Payer: Commercial Managed Care - HMO | Admitting: Family Medicine

## 2022-05-29 ENCOUNTER — Encounter: Payer: Commercial Managed Care - HMO | Admitting: Family Medicine

## 2022-05-29 ENCOUNTER — Encounter: Payer: Self-pay | Admitting: Family Medicine

## 2022-05-29 VITALS — BP 124/82 | HR 81 | Wt 351.0 lb

## 2022-05-29 DIAGNOSIS — I10 Essential (primary) hypertension: Secondary | ICD-10-CM

## 2022-05-29 DIAGNOSIS — Z4889 Encounter for other specified surgical aftercare: Secondary | ICD-10-CM

## 2022-05-29 NOTE — Progress Notes (Signed)
   Subjective:    Patient ID: [place] Huberty, female    DOB: [birth year], 32 y.o.   MRN: [id number]  HPI  Patient about 10 days from primary cesarean section. Failed induction. Doing well. BP a little elevated, but didn't take antihypertensives today.  Review of Systems     Objective:  BP 124/82   Pulse 81   Wt (!) 351 lb (159.2 kg)   LMP 09/07/2021   Breastfeeding No   BMI 54.97 kg/m    Physical Exam Vitals reviewed.  Constitutional:      Appearance: Normal appearance.  Abdominal:     Comments: Wound vac removed. Incision clean, dry, intact. Healing well.  Neurological:     Mental Status: She is alert.  Psychiatric:        Mood and Affect: Mood normal.        Behavior: Behavior normal.        Thought Content: Thought content normal.        Judgment: Judgment normal.       Assessment & Plan:  1. Encounter for post surgical wound check Continue wound care. Discussed when to call and get worked in.  2. Primary hypertension Improved on recheck. Continue antihypertensives.

## 2022-06-06 ENCOUNTER — Encounter: Payer: Commercial Managed Care - HMO | Admitting: Family Medicine

## 2022-06-12 ENCOUNTER — Encounter: Payer: Commercial Managed Care - HMO | Admitting: Family Medicine

## 2022-06-24 ENCOUNTER — Encounter: Payer: Self-pay | Admitting: Family Medicine

## 2022-06-24 NOTE — BH Specialist Note (Signed)
Pt did not arrive to video visit and did not answer the phone; Unable to leave voicemail as mailbox is full; left MyChart message for patient.

## 2022-07-01 ENCOUNTER — Encounter: Payer: Self-pay | Admitting: General Practice

## 2022-07-07 ENCOUNTER — Ambulatory Visit: Payer: Commercial Managed Care - HMO | Admitting: Clinical

## 2022-07-07 DIAGNOSIS — Z91199 Patient's noncompliance with other medical treatment and regimen due to unspecified reason: Secondary | ICD-10-CM

## 2022-07-10 ENCOUNTER — Encounter: Payer: Self-pay | Admitting: Family Medicine

## 2022-07-10 ENCOUNTER — Ambulatory Visit: Payer: Medicaid Other | Admitting: Family Medicine

## 2022-07-10 DIAGNOSIS — E119 Type 2 diabetes mellitus without complications: Secondary | ICD-10-CM

## 2022-07-10 DIAGNOSIS — I10 Essential (primary) hypertension: Secondary | ICD-10-CM

## 2022-07-10 MED ORDER — CITALOPRAM HYDROBROMIDE 20 MG PO TABS: 20.0000 mg | ORAL_TABLET | Freq: Every day | ORAL | 1 refills | Status: DC

## 2022-07-10 NOTE — Progress Notes (Signed)
[place] Partum Visit Note  Tammie Thomas is a 32 y.o. G34P0101 female who presents for a postpartum visit. She is 7 weeks postpartum following a primary cesarean section.  I have fully reviewed the prenatal and intrapartum course. The delivery was at 44 gestational weeks.  Anesthesia: epidural. Postpartum course has been normal. Baby is doing well. Baby is feeding by bottle - Similac Alimentum. Bleeding no bleeding. Bowel function is normal. Bladder function is normal. Patient is sexually active. Contraception method is none. Postpartum depression screening: negative.   Upstream - 07/10/22 1507       Pregnancy Intention Screening   Does the patient want to become pregnant in the next year? No    Does the patient's partner want to become pregnant in the next year? No    Would the patient like to discuss contraceptive options today? Yes      Contraception Wrap Up   Current Method No Method - Other Reason    End Method No Method - Other Reason    Contraception Counseling Provided Yes            The pregnancy intention screening data noted above was reviewed. Potential methods of contraception were discussed. The patient elected to proceed with No Method - Other Reason.   Edinburgh Postnatal Depression Scale - 07/10/22 1505       Edinburgh Postnatal Depression Scale:  In the Past 7 Days   I have been able to laugh and see the funny side of things. 0    I have looked forward with enjoyment to things. 0    I have blamed myself unnecessarily when things went wrong. 2    I have been anxious or worried for no good reason. 2    I have felt scared or panicky for no good reason. 1    Things have been getting on top of me. 1    I have been so unhappy that I have had difficulty sleeping. 1    I have felt sad or miserable. 0    I have been so unhappy that I have been crying. 0    The thought of harming myself has occurred to me. 0    Edinburgh Postnatal Depression Scale Total 7              Health Maintenance Due  Topic Date Due   COVID-19 Vaccine (1) Never done   FOOT EXAM  Never done   OPHTHALMOLOGY EXAM  Never done   DTaP/Tdap/Td (1 - Tdap) Never done   INFLUENZA VACCINE  Never done    The following portions of the patient's history were reviewed and updated as appropriate: allergies, current medications, past family history, past medical history, past social history, past surgical history, and problem list.  Review of Systems Pertinent items are noted in HPI.  Objective:  BP 123/77   Pulse 73   Wt (!) 348 lb (157.9 kg)   LMP 09/07/2021   Breastfeeding No   BMI 54.50 kg/m    General:  alert, cooperative, and no distress   Breasts:  not indicated  Lungs: clear to auscultation bilaterally  Heart:  regular rate and rhythm, S1, S2 normal, no murmur, click, rub or gallop  Abdomen: soft, non-tender; bowel sounds normal; no masses,  no organomegaly   Wound well approximated incision  GU exam:  not indicated       Assessment:   1. Postpartum care and examination Would like a prescription for celexa for  increased anxiety.  2. Primary hypertension Referred to primary care  3. Type 2 diabetes mellitus without complication, without long-term current use of insulin ([place]) Referred to primary care   Plan:   Essential components of care per ACOG recommendations:  1.  Mood and well being: Patient with negative depression screening today. Reviewed local resources for support.  - Patient tobacco use? No.   - hx of drug use? no  2. Infant care and feeding:  -Patient currently breastmilk feeding? No.  -Social determinants of health (SDOH) reviewed in EPIC. No concerns  3. Sexuality, contraception and birth spacing - Patient does not want a pregnancy in the next year.  - Reviewed reproductive life planning. Reviewed contraceptive methods based on pt preferences and effectiveness.  Patient desired Female Condom today.   - Discussed birth spacing of 18  months  4. Sleep and fatigue -Encouraged family/partner/community support of 4 hrs of uninterrupted sleep to help with mood and fatigue  5. Physical Recovery  - Discussed patients delivery and complications. She describes her labor as mixed. - Patient had a C-section failure to progress. - Patient has urinary incontinence? No. - Patient is safe to resume physical and sexual activity  6.  Health Maintenance - HM due items addressed Yes - Last pap smear  Diagnosis  Date Value Ref Range Status  11/28/2021   Final   - Negative for intraepithelial lesion or malignancy (NILM)   Pap smear not done at today's visit.  -Breast Cancer screening indicated? No.   7. Chronic Disease/Pregnancy Condition follow up: Hypertension  - PCP follow up  [place] for [place]

## 2022-08-27 ENCOUNTER — Encounter (INDEPENDENT_AMBULATORY_CARE_PROVIDER_SITE_OTHER): Payer: Medicaid Other | Admitting: Family Medicine

## 2022-09-02 ENCOUNTER — Ambulatory Visit (INDEPENDENT_AMBULATORY_CARE_PROVIDER_SITE_OTHER): Payer: Medicaid Other | Admitting: Family Medicine

## 2022-09-02 ENCOUNTER — Encounter (INDEPENDENT_AMBULATORY_CARE_PROVIDER_SITE_OTHER): Payer: Self-pay | Admitting: Family Medicine

## 2022-09-02 VITALS — BP 133/69 | HR 66 | Temp 98.0°F | Ht 67.5 in | Wt 349.0 lb

## 2022-09-02 DIAGNOSIS — Z6841 Body Mass Index (BMI) 40.0 and over, adult: Secondary | ICD-10-CM

## 2022-09-02 DIAGNOSIS — Z9189 Other specified personal risk factors, not elsewhere classified: Secondary | ICD-10-CM

## 2022-09-02 DIAGNOSIS — Z8759 Personal history of other complications of pregnancy, childbirth and the puerperium: Secondary | ICD-10-CM

## 2022-09-02 DIAGNOSIS — F39 Unspecified mood [affective] disorder: Secondary | ICD-10-CM | POA: Diagnosis not present

## 2022-09-02 NOTE — Progress Notes (Signed)
Carlye Grippe, DO, ABFM, ABOM Department of Obesity Medicine  [facility] [address] [place], [place], [place] [address] Office: [phone]  /  Fax: [phone]   Initial Visit  Tammie Thomas was seen in clinic today to evaluate for obesity. She is interested in losing weight to improve overall health and reduce the risk of weight related complications. She presents today to review program treatment options, initial physical assessment, and evaluation.     She was referred by: Self-Referral  When asked what else they would they like to accomplish? She states: Improve existing medical conditions and Improve quality of life  When asked how has your weight affected you? She states: Contributed to medical problems and Has affected mood   Some associated conditions: Gestational Diabetes, History of Preeclampsia (she has used bp med in the past for this)    Contributing factors: Family history, reduced physical activity, and culture.  Weight promoting medications identified: None  Current nutrition plan: None  Current level of physical activity: None  Current or previous pharmacotherapy: Metformin, Phentermine, and Other: Ozempic . She was on Ozempic for 1 month and lost 15 lbs.    Response to medication: Lost weight initially but regained.     Past medical history includes:   Past Medical History:  Diagnosis Date   Anxiety    Asthma    Cyst of pancreas 03/04/2018   GERD (gastroesophageal reflux disease)    Gestational diabetes    HLD (hyperlipidemia)    Hypertension    Obesity     Reviewed by clinician on day of visit: allergies, medications, problem list, medical history, surgical history, family history, social history, and previous encounter notes pertinent to obesity diagnosis.  Objective:   BP 133/69   Pulse 66   Temp 98 F (36.7 C)   Ht 5' 7.5" (1.715 m)   Wt (!) 349 lb (158.3 kg)   SpO2 100%   BMI 53.85 kg/m  She was  weighed on the bioimpedance scale: Body mass index is 53.85 kg/m.  Visceral Fat %: 21  Body Fat %: 58.7   General: Well Developed, well nourished, and in no acute distress.  HEENT: Normocephalic, atraumatic Skin: Warm and dry, cap RF less 2 sec, good turgor Chest:  Normal excursion, shape, no gross abn Respiratory: speaking in full sentences, no conversational dyspnea NeuroM-Sk: Ambulates w/o assistance, moves * 4 Psych: A and O *3, insight good, mood-full   Assessment and Plan:   At risk for diabetes mellitus Assessment: Condition is stable. - Patient has a history of gestational diabetes. She delivered her first baby in January 2024. Pt was on Insulin throughout her pregnancy, but did go off it shortly after the delivery of her baby.  - I informed patient that her history of gestational diabetes may put her at risk for diabetes mellitus.   Plan: - Esparanza was given diabetes prevention education and counseling today of more than 8 minutes.  - Importance of following a healthy meal plan with limiting amounts of simple carbohydrates discussed with patient - Briefly discussed treatment options, which always include dietary and lifestyle modification as first line.   - Continue to follow up with PCP regarding management of this condition.  - If pt decides to join program will check A1c and fasting Insulin.    Mood disorder (HCC) Assessment: Condition is stable. - No SI/HI. Mood is stable.  - Patient endorses that she does have a mix of depression and anxiety. -  She is not on any medication for this condition.  - She walks for management of her depression and anxiety symptoms.  Plan: - Continue with current walking regiment.  - Discussed with patient that eating healthier foods can improve mood as well improve emotional wellbeing.     History of pre-eclampsia Assessment: Condition is stable. BP Readings from Last 3 Encounters:  09/02/22 133/69  07/10/22 123/77  05/29/22 124/82   - Pt endorses that she used to take Nifedipine twice weekly. She has been off the medication for several months and her blood pressure has been stable since delivering her baby.   Plan: - Check ECG, CMP, TSH, FT4 next OV if pt decides to join program.  - We will continue to monitor closely alongside PCP/ specialists.  Pt reminded to also f/up with those individuals as instructed by them.  - We will continue to monitor symptoms as they relate to the her weight loss journey.    Class 3 severe obesity with serious comorbidity and body mass index (BMI) of 50.0 to 59.9 in adult, unspecified obesity type [facility]) Assessment: Condition is not optimized.  Fat mass is 205 lbs. Muscle mass is 137 lbs.   Plan:  She will work on gathering support from family and friends to begin weight loss journey. Work on eliminating or reducing the presence of highly processed, calorie dense foods in the home. Complete provided nutritional and psychosocial assessment questionnaire prior to her next visit.  Assess for cardiometabolic complications and nutritional deficiencies via fasting serologies.  Assess REE via indirect calorimetry to guide the creation of a reduced calorie, high protein meal plan to promote loss of fat mass.     Teryl Osterhoudt will follow up in the next 1-2 weeks to review the above steps and continue evaluation and treatment of their disease of obesity  Obesity Education Performed Today: She was weighed on the bioimpedance scale and results were discussed and documented above  We discussed obesity as a disease and the importance of a more detailed evaluation of all the factors contributing to the disease.  We discussed the importance of long term lifestyle changes which include nutrition, exercise and behavioral modifications as well as the importance of customizing this to her specific health and social needs.  We discussed the benefits of reaching a healthier weight to alleviate the symptoms  of existing conditions and reduce the risks of the biomechanical, metabolic and psychological effects of obesity.  Tamzyn Vanausdal appears to be in the action stage of change and states they are ready to start intensive lifestyle modifications and behavioral modifications.  45 minutes was spent today on this visit including the above counseling, pre-visit chart review, and post-visit documentation.  Attestation Statements:   Reviewed by clinician on day of visit: allergies, medications, problem list, medical history, surgical history, family history, social history, and previous encounter notes.  I,Special Puri,acting as a Neurosurgeon for [employer], DO.,have documented all relevant documentation on the behalf of Thomasene Lot, DO,as directed by  Thomasene Lot, DO while in the presence of Thomasene Lot, DO.   I, Thomasene Lot, DO, have reviewed all documentation for this visit. The documentation on 09/02/22 for the exam, diagnosis, procedures, and orders are all accurate and complete.

## 2022-11-25 ENCOUNTER — Telehealth: Payer: Self-pay

## 2022-11-25 NOTE — Telephone Encounter (Signed)
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  Best contact number is [phone].

## 2024-05-11 ENCOUNTER — Ambulatory Visit

## 2024-05-12 ENCOUNTER — Ambulatory Visit

## 2024-05-17 ENCOUNTER — Ambulatory Visit
Admission: EM | Admit: 2024-05-17 | Discharge: 2024-05-17 | Disposition: A | Discharge status: Home / Self Care | Attending: Family Medicine | Admitting: Family Medicine

## 2024-05-17 DIAGNOSIS — R350 Frequency of micturition: Secondary | ICD-10-CM | POA: Diagnosis not present

## 2024-05-17 DIAGNOSIS — N10 Acute tubulo-interstitial nephritis: Secondary | ICD-10-CM | POA: Insufficient documentation

## 2024-05-17 LAB — POCT URINE DIPSTICK
Bilirubin, UA: NEGATIVE
Blood, UA: NEGATIVE
Glucose, UA: NEGATIVE mg/dL
Ketones, POC UA: NEGATIVE mg/dL
Leukocytes, UA: NEGATIVE
Nitrite, UA: NEGATIVE
POC PROTEIN,UA: 30 — AB
Spec Grav, UA: 1.015
Urobilinogen, UA: 0.2 U/dL
pH, UA: 6

## 2024-05-17 MED ORDER — CIPROFLOXACIN HCL 500 MG PO TABS: 500.0000 mg | ORAL_TABLET | Freq: Two times a day (BID) | ORAL | 0 refills | Status: AC

## 2024-05-17 NOTE — Discharge Instructions (Signed)
 Please start ciprofloxacin  to address a mild case of kidney infection, pyelonephritis. Make sure you hydrate very well with plain water  and a quantity of 80-100 ounces of water  a day.  Please limit drinks that are considered urinary irritants such as fruit juices, soda, sweet tea, coffee, artifical sweetened drinks, energy drinks, alcohol.  These can worsen your urinary and genital symptoms but also be the source of them.  I will let you know about your urine culture results through MyChart to see if we need to prescribe or change your antibiotics based off of those results.

## 2024-05-17 NOTE — ED Provider Notes (Signed)
 " [facility]  Note:  This document was prepared using Conservation officer, historic buildings and may include unintentional dictation errors.  MRN: [id number] DOB: 27-Nov-1990  Subjective:   Tammie Thomas is a 34 y.o. female presenting for 1 week history of persistent urinary frequency, fatigue, bilateral flank pain and lower abdominal/pelvic pain.  Denies fever, n/v, rashes, dysuria, hematuria, vaginal discharge.  Has previously had an occasional UTI.  No history of renal stones.  Does not hydrate very well with water .  She does drink juices, soda, sweet tea.   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/04/2018   GERD (gastroesophageal reflux disease)    Gestational diabetes    HLD (hyperlipidemia)    Hypertension    Obesity      Past Surgical History:  Procedure Laterality Date   CESAREAN SECTION N/A 05/19/2022   Procedure: CESAREAN SECTION;  Surgeon: Lola Donnice HERO, MD;  Location: [facility] ORS;  Service: Obstetrics;  Laterality: N/A;   NO PAST SURGERIES      Family History  Problem Relation Age of Onset   Heart disease Mother    Hypertension Mother    Diabetes Father    Hypertension Father     Social History   Occupational History   Not on file  Tobacco Use   Smoking status: Former    Current packs/day: 0.00    Types: Cigarettes    Quit date: 02/25/2008    Years since quitting: 16.2   Smokeless tobacco: Never  Vaping Use   Vaping status: Never Used  Substance and Sexual Activity   Alcohol use: Not Currently   Drug use: No   Sexual activity: Not Currently    Birth control/protection: None     ROS   Objective:   Vitals: BP (!) 161/107 Comment (BP Location): left forearm  Pulse 84   Temp 97.8 F (36.6 C) (Oral)   Resp 20   LMP 04/29/2024   SpO2 97%   Physical Exam Constitutional:      General: She is not in acute distress.     Appearance: Normal appearance. She is well-developed. She is not ill-appearing, toxic-appearing or diaphoretic.  HENT:     Head: Normocephalic and atraumatic.     Nose: Nose normal.     Mouth/Throat:     Mouth: Mucous membranes are moist.     Pharynx: Oropharynx is clear.  Eyes:     General: No scleral icterus.       Right eye: No discharge.        Left eye: No discharge.     Extraocular Movements: Extraocular movements intact.     Conjunctiva/sclera: Conjunctivae normal.  Cardiovascular:     Rate and Rhythm: Normal rate.  Pulmonary:     Effort: Pulmonary effort is normal.  Abdominal:     General: Bowel sounds are normal. There is no distension.     Palpations: Abdomen is soft. There is no mass.     Tenderness: There is no abdominal tenderness. There is right CVA tenderness (mild) and left CVA tenderness (mild). There is no guarding or rebound.  Skin:    General: Skin is warm and dry.  Neurological:     General: No focal deficit present.     Mental Status: She is alert and oriented to person, place, and time.  Psychiatric:  Mood and Affect: Mood normal.        Behavior: Behavior normal.        Thought Content: Thought content normal.        Judgment: Judgment normal.     Results for orders placed or performed during the hospital encounter of 05/17/24 (from the past 24 hours)  POCT URINE DIPSTICK     Status: Abnormal   Collection Time: 05/17/24  1:29 PM  Result Value Ref Range   Color, UA yellow yellow   Clarity, UA hazy (A) clear   Glucose, UA negative negative mg/dL   Bilirubin, UA negative negative   Ketones, POC UA negative negative mg/dL   Spec Grav, UA 8.984 8.989 - 1.025   Blood, UA negative negative   pH, UA 6.0 5.0 - 8.0   POC PROTEIN,UA =30 (A) negative, trace   Urobilinogen, UA 0.2 0.2 or 1.0 E.U./dL   Nitrite, UA Negative Negative   Leukocytes, UA Negative Negative    Assessment and Plan :   PDMP not reviewed this encounter.  1. Acute  pyelonephritis   2. Urinary frequency      Will cover for mild pyelonephritis with ciprofloxacin .  Urine culture pending.  Recommended consistent hydration, limiting urinary irritants. Counseled patient on potential for adverse effects with medications prescribed/recommended today, ER and return-to-clinic precautions discussed, patient verbalized understanding.     [1] No Known Allergies    Christopher Savannah, PA-C 05/17/24 1410  "

## 2024-05-17 NOTE — ED Triage Notes (Addendum)
 Pt c/o lower abd pain, bilat flank pain, low energy, urinary freq-sx started 1 week ago-NAD-steady gait

## 2024-05-18 LAB — URINE CULTURE: Culture: <10000 — AB

## 2024-05-21 ENCOUNTER — Ambulatory Visit (HOSPITAL_COMMUNITY): Payer: Self-pay
